# Patient Record
Sex: Male | Born: 1940 | Race: Black or African American | Hispanic: No | Marital: Married | Smoking: Former smoker
Health system: Southern US, Community
[De-identification: ages and names within clinical notes are randomized; demographics above are authoritative.]

## PROBLEM LIST (undated history)

## (undated) DIAGNOSIS — M549 Dorsalgia, unspecified: Secondary | ICD-10-CM

## (undated) DIAGNOSIS — M25519 Pain in unspecified shoulder: Secondary | ICD-10-CM

## (undated) DIAGNOSIS — M1990 Unspecified osteoarthritis, unspecified site: Secondary | ICD-10-CM

## (undated) DIAGNOSIS — M542 Cervicalgia: Secondary | ICD-10-CM

## (undated) HISTORY — DX: Cervicalgia: M54.2

## (undated) HISTORY — DX: Dorsalgia, unspecified: M54.9

## (undated) HISTORY — PX: FRACTURE SURGERY: SHX138

## (undated) HISTORY — DX: Pain in unspecified shoulder: M25.519

## (undated) HISTORY — PX: OTHER SURGICAL HISTORY: SHX169

---

## 2003-12-28 ENCOUNTER — Ambulatory Visit (HOSPITAL_COMMUNITY): Admission: RE | Admit: 2003-12-28 | Discharge: 2003-12-28 | Payer: Self-pay | Admitting: Chiropractic Medicine

## 2008-09-05 ENCOUNTER — Emergency Department (HOSPITAL_COMMUNITY): Admission: EM | Admit: 2008-09-05 | Discharge: 2008-09-05 | Payer: Self-pay | Admitting: Emergency Medicine

## 2008-11-12 ENCOUNTER — Encounter (INDEPENDENT_AMBULATORY_CARE_PROVIDER_SITE_OTHER): Payer: Self-pay | Admitting: *Deleted

## 2009-07-27 ENCOUNTER — Telehealth: Payer: Self-pay | Admitting: Gastroenterology

## 2009-08-06 ENCOUNTER — Ambulatory Visit (HOSPITAL_COMMUNITY): Admission: RE | Admit: 2009-08-06 | Discharge: 2009-08-06 | Payer: Self-pay | Admitting: Chiropractic Medicine

## 2010-06-28 NOTE — Progress Notes (Signed)
Summary: Schedule Colonoscopy  Phone Note Outgoing Call Call back at [facility] Phone [phone]   Call placed by: Harlow Mares CMA Duncan Dull),  July 27, 2009 12:01 PM Call placed to: Patient Summary of Call: No Answer, patient needs colonoscopy Initial call taken by: Harlow Mares CMA Duncan Dull),  July 27, 2009 12:01 PM  Follow-up for Phone Call        Left message on patients machine to call back.  Follow-up by: Harlow Mares CMA Duncan Dull),  August 05, 2009 12:02 PM

## 2010-08-22 LAB — CREATININE, SERUM: GFR calc non Af Amer: 53 mL/min — ABNORMAL LOW (ref >60)

## 2010-09-07 LAB — URINALYSIS, ROUTINE W REFLEX MICROSCOPIC: Leukocytes, UA: NEGATIVE

## 2010-09-07 LAB — CBC
HCT: 54.1 % — ABNORMAL HIGH (ref 39.0–52.0)
Hemoglobin: 17.8 g/dL — ABNORMAL HIGH (ref 13.0–17.0)
MCV: 88.5 fL (ref 78.0–100.0)
Platelets: 184 10*3/uL (ref 150–400)
RBC: 6.11 MIL/uL — ABNORMAL HIGH (ref 4.22–5.81)
RDW: 14 % (ref 11.5–15.5)
WBC: 6.4 10*3/uL (ref 4.0–10.5)

## 2010-09-07 LAB — COMPREHENSIVE METABOLIC PANEL
ALT: 32 U/L (ref 0–53)
BUN: 27 mg/dL — ABNORMAL HIGH (ref 6–23)
CO2: 17 mEq/L — ABNORMAL LOW (ref 19–32)
GFR calc Af Amer: 38 mL/min — ABNORMAL LOW (ref >60)
GFR calc non Af Amer: 31 mL/min — ABNORMAL LOW (ref >60)
Glucose, Bld: 117 mg/dL — ABNORMAL HIGH (ref 70–99)
Potassium: 4 mEq/L (ref 3.5–5.1)
Sodium: 133 mEq/L — ABNORMAL LOW (ref 135–145)

## 2010-09-07 LAB — DIFFERENTIAL
Monocytes Absolute: 0.9 10*3/uL (ref 0.1–1.0)
Monocytes Relative: 14 % — ABNORMAL HIGH (ref 3–12)
Neutro Abs: 4.1 10*3/uL (ref 1.7–7.7)
Neutrophils Relative %: 64 % (ref 43–77)

## 2010-09-07 LAB — URINE MICROSCOPIC-ADD ON

## 2011-12-12 ENCOUNTER — Encounter: Payer: Self-pay | Admitting: Gastroenterology

## 2012-07-18 ENCOUNTER — Ambulatory Visit: Payer: Self-pay | Admitting: Family Medicine

## 2012-07-18 VITALS — BP 133/72 | HR 78 | Temp 98.1°F | Resp 16 | Ht 69.0 in | Wt 217.0 lb

## 2012-07-18 DIAGNOSIS — Z024 Encounter for examination for driving license: Secondary | ICD-10-CM

## 2012-07-18 DIAGNOSIS — Z0289 Encounter for other administrative examinations: Secondary | ICD-10-CM

## 2012-07-18 NOTE — Patient Instructions (Signed)
Return if problems
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
## 2013-09-14 ENCOUNTER — Emergency Department (HOSPITAL_COMMUNITY)
Admission: EM | Admit: 2013-09-14 | Discharge: 2013-09-14 | Disposition: A | Discharge status: Home / Self Care | Payer: Non-veteran care | Attending: Emergency Medicine | Admitting: Emergency Medicine

## 2013-09-14 DIAGNOSIS — M62838 Other muscle spasm: Secondary | ICD-10-CM | POA: Insufficient documentation

## 2013-09-14 DIAGNOSIS — J3489 Other specified disorders of nose and nasal sinuses: Secondary | ICD-10-CM | POA: Insufficient documentation

## 2013-09-14 DIAGNOSIS — Z8781 Personal history of (healed) traumatic fracture: Secondary | ICD-10-CM | POA: Insufficient documentation

## 2013-09-14 DIAGNOSIS — Z87891 Personal history of nicotine dependence: Secondary | ICD-10-CM | POA: Insufficient documentation

## 2013-09-14 DIAGNOSIS — Z79899 Other long term (current) drug therapy: Secondary | ICD-10-CM | POA: Insufficient documentation

## 2013-09-14 MED ORDER — HYDROCODONE-ACETAMINOPHEN 5-325 MG PO TABS: 1.0000 | ORAL_TABLET | Freq: Four times a day (QID) | ORAL | Status: DC | PRN

## 2013-09-14 MED ORDER — DIAZEPAM 5 MG PO TABS: 5.0000 mg | ORAL_TABLET | Freq: Three times a day (TID) | ORAL | Status: DC | PRN

## 2013-09-14 MED ORDER — DIAZEPAM 5 MG PO TABS
5.0000 mg | ORAL_TABLET | Freq: Once | ORAL | Status: AC
  Administered 2013-09-14: 5 mg via ORAL
  Filled 2013-09-14: qty 1

## 2013-09-14 MED ORDER — HYDROCODONE-ACETAMINOPHEN 5-325 MG PO TABS
1.0000 | ORAL_TABLET | Freq: Once | ORAL | Status: AC
  Administered 2013-09-14: 1 via ORAL
  Filled 2013-09-14: qty 1

## 2013-09-14 NOTE — ED Provider Notes (Signed)
CSN: [id number]     Arrival date & time 09/14/13  [id number] History   First MD Initiated Contact with Patient 09/14/13 [phone]     Chief Complaint  Patient presents with  . Neck Pain     (Consider location/radiation/quality/duration/timing/severity/associated sxs/prior Treatment) Patient is a 73 y.o. male presenting with neck pain. The history is provided by the patient.  Neck Pain Pain location:  R side Quality:  Aching Pain radiates to:  Does not radiate Pain severity:  Moderate Pain is:  Same all the time Onset quality:  Gradual Duration:  2 days Timing:  Constant Progression:  Worsening Chronicity:  New Context: not fall, not jumping from heights, not lifting a heavy object, not MCA and not pedestrian accident   Relieved by:  Analgesics Worsened by:  Twisting Associated symptoms: no chest pain, no fever, no numbness, no paresis, no tingling, no visual change and no weakness     No past medical history on file. Past Surgical History  Procedure Laterality Date  . Fracture surgery     No family history on file. History  Substance Use Topics  . Smoking status: Former Games developermoker  . Smokeless tobacco: Not on file  . Alcohol Use: No    Review of Systems  Constitutional: Negative for fever and chills.  HENT: Positive for congestion (1 week ago had sinus symptoms). Negative for sneezing and tinnitus.   Respiratory: Negative for cough and shortness of breath.   Cardiovascular: Negative for chest pain.  Musculoskeletal: Positive for neck pain.  Neurological: Negative for tingling, weakness and numbness.  All other systems reviewed and are negative.     Allergies  Review of patient's allergies indicates no known allergies.  Home Medications   Prior to Admission medications   Medication Sig Start Date End Date Taking? Authorizing Provider  CALCIUM PO Take 1 tablet by mouth daily.   Yes Historical Provider, MD  Cholecalciferol (VITAMIN D PO) Take 1 tablet by mouth daily.   Yes  Historical Provider, MD  HYDROcodone-acetaminophen (NORCO/VICODIN) 5-325 MG per tablet Take by mouth every 6 (six) hours as needed for moderate pain or severe pain.   Yes Historical Provider, MD  Multiple Vitamin (MULTIVITAMIN WITH MINERALS) TABS tablet Take 1 tablet by mouth daily.   Yes Historical Provider, MD  Omega-3 Fatty Acids (FISH OIL PO) Take 1 capsule by mouth daily.   Yes Historical Provider, MD   BP 152/96  Pulse 96  Temp(Src) 98.7 F (37.1 C) (Oral)  Resp 18  Ht 5\' 11"  (1.803 m)  Wt 210 lb (95.255 kg)  BMI 29.30 kg/m2  SpO2 95% Physical Exam  Nursing note and vitals reviewed. Constitutional: He is oriented to person, place, and time. He appears well-developed and well-nourished. No distress.  HENT:  Head: Normocephalic and atraumatic.  Mouth/Throat: No oropharyngeal exudate.  Eyes: EOM are normal. Pupils are equal, round, and reactive to light.  Neck: Neck supple. No spinous process tenderness and no muscular tenderness present. Carotid bruit is not present. No rigidity. Decreased range of motion present.    Cardiovascular: Normal rate and regular rhythm.  Exam reveals no friction rub.   No murmur heard. Pulmonary/Chest: Effort normal and breath sounds normal. No respiratory distress. He has no wheezes. He has no rales.  Abdominal: Soft. He exhibits no distension. There is no tenderness. There is no rebound.  Musculoskeletal: He exhibits no edema.       Right shoulder: He exhibits spasm.       Arms: Neurological: He  is alert and oriented to person, place, and time.  Skin: He is not diaphoretic.    ED Course  Procedures (including critical care time) Labs Review Labs Reviewed - No data to display  Imaging Review No results found.   EKG Interpretation None      MDM   Final diagnoses:  Muscle spasms of neck    73 year old male with gradual onset right neck pain. No trauma. Pain since Friday. Some relief with Vicodin. He was like muscle cramps. Pain with  turning neck to the side. No fevers, muscle weakness, tingling. Here vitals are stable. On exam he has right upper trapezius is and sternocleidomastoid spasm. No spasm on left. Neurovascularly intact distally, normal neuro exam. No bruits on exam. Without history of trauma, vascular injury, other anterior neck pain, likely muscle spasm. Will treat with Valium and Vicodin. No need for imaging at this time. Spasm alleviated with meds, still hurting, but improved. Clinically spasm almost resolved. Given vicodin, valium to help with pain at home, instructed to f/u with PCP.  Dagmar HaitWilliam Orbie Grupe, MD 09/14/13 [phone]

## 2013-09-14 NOTE — Discharge Instructions (Signed)
°  Muscle Cramps and Spasms °Muscle cramps and spasms occur when a muscle or muscles tighten and you have no control over this tightening (involuntary muscle contraction). They are a common problem and can develop in any muscle. The most common place is in the calf muscles of the leg. Both muscle cramps and muscle spasms are involuntary muscle contractions, but they also have differences:  °· Muscle cramps are sporadic and painful. They may last a few seconds to a quarter of an hour. Muscle cramps are often more forceful and last longer than muscle spasms. °· Muscle spasms may or may not be painful. They may also last just a few seconds or much longer. °CAUSES  °It is uncommon for cramps or spasms to be due to a serious underlying problem. In many cases, the cause of cramps or spasms is unknown. Some common causes are:  °· Overexertion.   °· Overuse from repetitive motions (doing the same thing over and over).   °· Remaining in a certain position for a long period of time.   °· Improper preparation, form, or technique while performing a sport or activity.   °· Dehydration.   °· Injury.   °· Side effects of some medicines.   °· Abnormally low levels of the salts and ions in your blood (electrolytes), especially potassium and calcium. This could happen if you are taking water pills (diuretics) or you are pregnant.   °Some underlying medical problems can make it more likely to develop cramps or spasms. These include, but are not limited to:  °· Diabetes.   °· Parkinson disease.   °· Hormone disorders, such as thyroid problems.   °· Alcohol abuse.   °· Diseases specific to muscles, joints, and bones.   °· Blood vessel disease where not enough blood is getting to the muscles.   °HOME CARE INSTRUCTIONS  °· Stay well hydrated. Drink enough water and fluids to keep your urine clear or pale yellow. °· It may be helpful to massage, stretch, and relax the affected muscle. °· For tight or tense muscles, use a warm towel, heating  pad, or hot shower water directed to the affected area. °· If you are sore or have pain after a cramp or spasm, applying ice to the affected area may relieve discomfort. °· Put ice in a plastic bag. °· Place a towel between your skin and the bag. °· Leave the ice on for 15-20 minutes, 03-04 times a day. °· Medicines used to treat a known cause of cramps or spasms may help reduce their frequency or severity. Only take over-the-counter or prescription medicines as directed by your caregiver. °SEEK MEDICAL CARE IF:  °Your cramps or spasms get more severe, more frequent, or do not improve over time.  °MAKE SURE YOU:  °· Understand these instructions. °· Will watch your condition. °· Will get help right away if you are not doing well or get worse. °Document Released: 11/04/2001 Document Revised: 09/09/2012 Document Reviewed: 05/01/2012 °[employer]® Patient Information ©2014 [employer]. ° ° °

## 2013-09-14 NOTE — ED Notes (Signed)
Patient reports he has had neck pain since Friday, and says it feels like when you wake up with a cramp in your neck, only worse.  He took 1 hydrocodone/acetaminophen tablet for pain management, that only had small relief.

## 2013-11-10 ENCOUNTER — Ambulatory Visit: Payer: Non-veteran care | Admitting: Interventional Cardiology

## 2015-10-18 ENCOUNTER — Encounter (HOSPITAL_COMMUNITY): Payer: Self-pay | Admitting: *Deleted

## 2015-10-18 ENCOUNTER — Emergency Department (HOSPITAL_COMMUNITY)
Admission: EM | Admit: 2015-10-18 | Discharge: 2015-10-18 | Disposition: A | Discharge status: Home / Self Care | Payer: No Typology Code available for payment source | Attending: Emergency Medicine | Admitting: Emergency Medicine

## 2015-10-18 DIAGNOSIS — Z79899 Other long term (current) drug therapy: Secondary | ICD-10-CM | POA: Insufficient documentation

## 2015-10-18 DIAGNOSIS — Y998 Other external cause status: Secondary | ICD-10-CM | POA: Diagnosis not present

## 2015-10-18 DIAGNOSIS — Y9389 Activity, other specified: Secondary | ICD-10-CM | POA: Insufficient documentation

## 2015-10-18 DIAGNOSIS — Y92410 Unspecified street and highway as the place of occurrence of the external cause: Secondary | ICD-10-CM | POA: Insufficient documentation

## 2015-10-18 DIAGNOSIS — S3992XA Unspecified injury of lower back, initial encounter: Secondary | ICD-10-CM | POA: Diagnosis present

## 2015-10-18 DIAGNOSIS — S161XXA Strain of muscle, fascia and tendon at neck level, initial encounter: Secondary | ICD-10-CM | POA: Diagnosis not present

## 2015-10-18 DIAGNOSIS — Z87891 Personal history of nicotine dependence: Secondary | ICD-10-CM | POA: Insufficient documentation

## 2015-10-18 DIAGNOSIS — S39012A Strain of muscle, fascia and tendon of lower back, initial encounter: Secondary | ICD-10-CM

## 2015-10-18 MED ORDER — HYDROCODONE-ACETAMINOPHEN 5-325 MG PO TABS: 1.0000 | ORAL_TABLET | Freq: Four times a day (QID) | ORAL | Status: DC | PRN
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
## 2015-10-18 NOTE — ED Notes (Signed)
1st and 2nd right toes taped with 2x2 between. Good cap refill. Soft cervical collar applied and adjusted. No airway impairment and pt states comfortable.
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
## 2015-10-18 NOTE — ED Notes (Signed)
Pt states he understands instructions  

## 2015-10-18 NOTE — ED Notes (Signed)
Pt to room 19 [address]. Moves to bed without assist.

## 2015-11-23 ENCOUNTER — Other Ambulatory Visit: Payer: Self-pay | Admitting: Chiropractic Medicine

## 2015-11-23 DIAGNOSIS — M5136 Other intervertebral disc degeneration, lumbar region: Secondary | ICD-10-CM

## 2015-11-23 DIAGNOSIS — M5416 Radiculopathy, lumbar region: Secondary | ICD-10-CM

## 2015-12-13 ENCOUNTER — Ambulatory Visit
Admission: RE | Admit: 2015-12-13 | Discharge: 2015-12-13 | Disposition: A | Discharge status: Home / Self Care | Payer: Self-pay | Source: Ambulatory Visit | Attending: Chiropractic Medicine | Admitting: Chiropractic Medicine

## 2015-12-13 DIAGNOSIS — M5416 Radiculopathy, lumbar region: Secondary | ICD-10-CM

## 2015-12-13 DIAGNOSIS — M5136 Other intervertebral disc degeneration, lumbar region: Secondary | ICD-10-CM

## 2016-06-12 ENCOUNTER — Encounter: Payer: Self-pay | Admitting: Neurology

## 2016-06-12 ENCOUNTER — Ambulatory Visit (INDEPENDENT_AMBULATORY_CARE_PROVIDER_SITE_OTHER): Payer: Self-pay | Admitting: Neurology

## 2016-06-12 DIAGNOSIS — R200 Anesthesia of skin: Secondary | ICD-10-CM

## 2016-06-12 DIAGNOSIS — M791 Myalgia, unspecified site: Secondary | ICD-10-CM

## 2016-06-12 DIAGNOSIS — M5440 Lumbago with sciatica, unspecified side: Secondary | ICD-10-CM

## 2016-06-12 DIAGNOSIS — M5010 Cervical disc disorder with radiculopathy, unspecified cervical region: Secondary | ICD-10-CM

## 2016-06-12 DIAGNOSIS — R519 Headache, unspecified: Secondary | ICD-10-CM

## 2016-06-12 DIAGNOSIS — F0781 Postconcussional syndrome: Secondary | ICD-10-CM

## 2016-06-12 DIAGNOSIS — R51 Headache: Secondary | ICD-10-CM

## 2016-06-12 DIAGNOSIS — M48061 Spinal stenosis, lumbar region without neurogenic claudication: Secondary | ICD-10-CM

## 2016-06-12 DIAGNOSIS — M542 Cervicalgia: Secondary | ICD-10-CM

## 2016-06-12 DIAGNOSIS — G8929 Other chronic pain: Secondary | ICD-10-CM

## 2016-06-12 MED ORDER — CYCLOBENZAPRINE HCL 5 MG PO TABS: 5.0000 mg | ORAL_TABLET | Freq: Every day | ORAL | 2 refills | Status: DC
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
## 2016-06-12 NOTE — Patient Instructions (Signed)
Remember to drink plenty of fluid, eat healthy meals and do not skip any meals. Try to eat protein with a every meal and eat a healthy snack such as fruit or nuts in between meals. Try to keep a regular sleep-wake schedule and try to exercise daily, particularly in the form of walking, 20-30 minutes a day, if you can.   As far as your medications are concerned, I would like to suggest: Flexeril at night for the muscle spasms  As far as diagnostic testing: MRI cervical spine, Physical therapy and evaluation by Dr. Shon [facility]atonBrooks [facility]  I would like to see you back if needed, sooner if we need to. Please call us with any interim questions, concerns, problems, updates or refill requests.   Our phone number is [phone]. We also have an after hours call service for urgent matters and there is a physician on-call for urgent questions. For any emergencies you know to call 911 or go to the nearest emergency room  Cyclobenzaprine tablets What is this medicine? CYCLOBENZAPRINE (sye kloe BEN za preen) is a muscle relaxer. It is used to treat muscle pain, spasms, and stiffness. This medicine may be used for other purposes; ask your health care provider or pharmacist if you have questions. COMMON BRAND NAME(S): Fexmid, Flexeril What should I tell my health care provider before I take this medicine? They need to know if you have any of these conditions: -heart disease, irregular heartbeat, or previous heart attack -liver disease -thyroid problem -an unusual or allergic reaction to cyclobenzaprine, tricyclic antidepressants, lactose, other medicines, foods, dyes, or preservatives -pregnant or trying to get pregnant -breast-feeding How should I use this medicine? Take this medicine by mouth with a glass of water. Follow the directions on the prescription label. If this medicine upsets your stomach, take it with food or milk. Take your medicine at regular intervals. Do not take it more often  than directed. Talk to your pediatrician regarding the use of this medicine in children. Special care may be needed. Overdosage: If you think you have taken too much of this medicine contact a poison control center or emergency room at once. NOTE: This medicine is only for you. Do not share this medicine with others. What if I miss a dose? If you miss a dose, take it as soon as you can. If it is almost time for your next dose, take only that dose. Do not take double or extra doses. What may interact with this medicine? Do not take this medicine with any of the following medications: -certain medicines for fungal infections like fluconazole, itraconazole, ketoconazole, posaconazole, voriconazole -cisapride -dofetilide -dronedarone -halofantrine -levomethadyl -MAOIs like Carbex, Eldepryl, Marplan, Nardil, and Parnate -narcotic medicines for cough -pimozide -thioridazine -ziprasidone This medicine may also interact with the following medications: -alcohol -antihistamines for allergy, cough and cold -certain medicines for anxiety or sleep -certain medicines for cancer -certain medicines for depression like amitriptyline, fluoxetine, sertraline -certain medicines for infection like alfuzosin, chloroquine, clarithromycin, levofloxacin, mefloquine, pentamidine, troleandomycin -certain medicines for irregular heart beat -certain medicines for seizures like phenobarbital, primidone -contrast dyes -general anesthetics like halothane, isoflurane, methoxyflurane, propofol -local anesthetics like lidocaine, pramoxine, tetracaine -medicines that relax muscles for surgery -narcotic medicines for pain -other medicines that prolong the QT interval (cause an abnormal heart rhythm) -phenothiazines like chlorpromazine, mesoridazine, prochlorperazine This list may not describe all possible interactions. Give your health care provider a list of all the medicines, herbs, non-prescription drugs, or  dietary supplements you use. Also  tell them if you smoke, drink alcohol, or use illegal drugs. Some items may interact with your medicine. What should I watch for while using this medicine? Tell your doctor or health care professional if your symptoms do not start to get better or if they get worse. You may get drowsy or dizzy. Do not drive, use machinery, or do anything that needs mental alertness until you know how this medicine affects you. Do not stand or sit up quickly, especially if you are an older patient. This reduces the risk of dizzy or fainting spells. Alcohol may interfere with the effect of this medicine. Avoid alcoholic drinks. If you are taking another medicine that also causes drowsiness, you may have more side effects. Give your health care provider a list of all medicines you use. Your doctor will tell you how much medicine to take. Do not take more medicine than directed. Call emergency for help if you have problems breathing or unusual sleepiness. Your mouth may get dry. Chewing sugarless gum or sucking hard candy, and drinking plenty of water may help. Contact your doctor if the problem does not go away or is severe. What side effects may I notice from receiving this medicine? Side effects that you should report to your doctor or health care professional as soon as possible: -allergic reactions like skin rash, itching or hives, swelling of the face, lips, or tongue -breathing problems -chest pain -fast, irregular heartbeat -hallucinations -seizures -unusually weak or tired Side effects that usually do not require medical attention (report to your doctor or health care professional if they continue or are bothersome): -headache -nausea, vomiting This list may not describe all possible side effects. Call your doctor for medical advice about side effects. You may report side effects to FDA at 1-800-FDA-1088. Where should I keep my medicine? Keep out of the reach of  children. Store at room temperature between 15 and 30 degrees C (59 and 86 degrees F). Keep container tightly closed. Throw away any unused medicine after the expiration date. NOTE: This sheet is a summary. It may not cover all possible information. If you have questions about this medicine, talk to your doctor, pharmacist, or health care provider.  2017 Elsevier/Gold Standard (2015-02-23 12:05:46)

## 2016-06-14 ENCOUNTER — Encounter: Payer: Self-pay | Admitting: Neurology

## 2016-06-22 ENCOUNTER — Telehealth: Payer: Self-pay | Admitting: *Deleted

## 2016-06-22 NOTE — Telephone Encounter (Signed)
Received call from patient stating he wanted Dr Lucia GaskinsAhern to know that he had scans done previously at [facility]  [address] He stated he had his neck, shoulder and back scanned in  11/2015. He wanted Dr Lucia GaskinsAhern to be aware. Per notes in MRI appointments, patient was unable to afford those scans Dr Lucia GaskinsAhern ordered on 06/14/16. Will route to RN.

## 2016-06-22 NOTE — Telephone Encounter (Signed)
Called and LVM relaying Dr Trevor MaceAhern's message below. Gave [facility] phone number if he has further questions or concerns.

## 2016-06-22 NOTE — Telephone Encounter (Signed)
Dr Lucia GaskinsAhern- Lorain ChildesFYI. Do you want me to do anything further?

## 2016-06-22 NOTE — Telephone Encounter (Signed)
Thanks emma, those images are not in epic so I don't have access to them or know what kind of images they are or their results. I referred him to Dr. Shon BatonBrooks in Orthopaedics due to findings in the mri of his lumbar spine and I suggest that he get those images from july on disk and have Dr. Shon BatonBrooks take a look at those images of his neck and shoulder and then Dr. Shon BatonBrooks can if MRI cervical spine is still warranted. But I still highly recommend the MRI of the brain. Thanks emma, if you don;t mind please let patient know thanks!

## 2016-06-22 NOTE — Telephone Encounter (Incomplete)
Thanks emma. I don;t have access to these scans so I am not sure what kind of scans those are, they are not in EPIC. Regardless I still recommend MRI brain and cervical spine.  thanks

## 2016-06-23 ENCOUNTER — Telehealth: Payer: Self-pay | Admitting: Neurology

## 2016-06-23 NOTE — Telephone Encounter (Signed)
Annabelle HarmanDana, can we follow up on the referral to dr brooks for lumbar radiculopathy? Also make sure patient brings his mri lumbar spine on a disk to his appointment thanks

## 2016-06-23 NOTE — Telephone Encounter (Signed)
Patient returning nurse's call. Please call  °

## 2016-06-23 NOTE — Telephone Encounter (Signed)
Returned patient's call and repeated both of Dr Trevor MaceAhern's messages. He stated he already had the disks from his previous scans. He asked for Dr Shon BatonBrooks address, phone; given to him.  This RN advised he call Dr Shon BatonBrooks office today and check on referral. Advised him again that Dr Lucia GaskinsAhern still recommends MRI brain. He verbalized understanding, appreciation of call back.

## 2016-06-23 NOTE — Telephone Encounter (Signed)
error 

## 2016-06-26 ENCOUNTER — Telehealth: Payer: Self-pay | Admitting: Neurology

## 2016-06-26 ENCOUNTER — Ambulatory Visit (INDEPENDENT_AMBULATORY_CARE_PROVIDER_SITE_OTHER): Payer: Self-pay | Admitting: Family Medicine

## 2016-06-26 VITALS — BP 158/106 | Temp 98.3°F | Ht 71.0 in | Wt 207.2 lb

## 2016-06-26 DIAGNOSIS — Z024 Encounter for examination for driving license: Secondary | ICD-10-CM

## 2016-06-26 DIAGNOSIS — R030 Elevated blood-pressure reading, without diagnosis of hypertension: Secondary | ICD-10-CM

## 2016-06-26 NOTE — Telephone Encounter (Signed)
Gave Dr Lucia GaskinsAhern faxed notice of this as FYI also.

## 2016-06-26 NOTE — Telephone Encounter (Signed)
Patient is not ready to schedule at this time . [facility] patient will call back when he is ready to schedule.

## 2016-06-26 NOTE — Patient Instructions (Addendum)
  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 Follow up with your primary care provider to discuss blood pressure and recheck to determine if medication needed.   IF you received an x-ray today, you will receive an invoice from [facility]. Please contact [facility] at [phone] with questions or concerns regarding your invoice.   IF you received labwork today, you will receive an invoice from [facility]. Please contact [facility] at [phone] with questions or concerns regarding your invoice.   Our billing staff will not be able to assist you with questions regarding bills from these companies.  You will be contacted with the lab results as soon as they are available. The fastest way to get your results is to activate your My Chart account. Instructions are located on the last page of this paperwork. If you have not heard from us regarding the results in 2 weeks, please contact this office.
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
## 2016-06-26 NOTE — Telephone Encounter (Signed)
Called and left to make sure he had MRI CD.

## 2017-01-02 ENCOUNTER — Encounter: Payer: Self-pay | Admitting: Physician Assistant

## 2017-01-19 ENCOUNTER — Encounter (HOSPITAL_COMMUNITY): Payer: Self-pay | Admitting: Emergency Medicine

## 2017-01-19 ENCOUNTER — Emergency Department (HOSPITAL_COMMUNITY): Payer: Self-pay

## 2017-01-19 ENCOUNTER — Emergency Department (HOSPITAL_COMMUNITY)
Admission: EM | Admit: 2017-01-19 | Discharge: 2017-01-19 | Disposition: A | Discharge status: Home / Self Care | Payer: Self-pay | Attending: Emergency Medicine | Admitting: Emergency Medicine

## 2017-01-19 DIAGNOSIS — M5442 Lumbago with sciatica, left side: Secondary | ICD-10-CM | POA: Insufficient documentation

## 2017-01-19 DIAGNOSIS — Z87891 Personal history of nicotine dependence: Secondary | ICD-10-CM | POA: Insufficient documentation

## 2017-01-19 MED ORDER — PREDNISONE 20 MG PO TABS: ORAL_TABLET | ORAL | 0 refills | Status: DC

## 2017-01-19 MED ORDER — HYDROCODONE-ACETAMINOPHEN 5-325 MG PO TABS: 1.0000 | ORAL_TABLET | ORAL | 0 refills | Status: DC | PRN

## 2017-01-19 NOTE — ED Notes (Signed)
Returned from xray
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
## 2017-01-19 NOTE — ED Notes (Signed)
C/O LBP to left leg x 2 days. Hx of "ruptured disc" in the past with sx per Dr. Jeral Fruit. Has some difficulty with ambulation.

## 2017-01-19 NOTE — Discharge Instructions (Signed)
Recommend to start the steroid taper today. You can reserve the pain medication for only as needed usage. Follow-up with the spine specialist-- call to make  an appt. Return to the ED for new or worsening symptoms.

## 2018-09-21 ENCOUNTER — Other Ambulatory Visit: Payer: Self-pay

## 2018-09-21 ENCOUNTER — Encounter (HOSPITAL_COMMUNITY): Payer: Self-pay

## 2018-09-21 ENCOUNTER — Emergency Department (HOSPITAL_COMMUNITY): Payer: Non-veteran care

## 2018-09-21 ENCOUNTER — Emergency Department (HOSPITAL_COMMUNITY)
Admission: EM | Admit: 2018-09-21 | Discharge: 2018-09-21 | Disposition: A | Discharge status: Home / Self Care | Payer: Non-veteran care | Attending: Emergency Medicine | Admitting: Emergency Medicine

## 2018-09-21 DIAGNOSIS — Z87891 Personal history of nicotine dependence: Secondary | ICD-10-CM | POA: Insufficient documentation

## 2018-09-21 DIAGNOSIS — R030 Elevated blood-pressure reading, without diagnosis of hypertension: Secondary | ICD-10-CM | POA: Insufficient documentation

## 2018-09-21 DIAGNOSIS — R1032 Left lower quadrant pain: Secondary | ICD-10-CM | POA: Diagnosis present

## 2018-09-21 DIAGNOSIS — R109 Unspecified abdominal pain: Secondary | ICD-10-CM

## 2018-09-21 LAB — URINALYSIS, ROUTINE W REFLEX MICROSCOPIC
Bilirubin Urine: NEGATIVE
Glucose, UA: NEGATIVE mg/dL
Hgb urine dipstick: NEGATIVE
Ketones, ur: NEGATIVE mg/dL
Leukocytes,Ua: NEGATIVE
Nitrite: NEGATIVE
Protein, ur: NEGATIVE mg/dL
Specific Gravity, Urine: 1.015 (ref 1.005–1.030)
pH: 7 (ref 5.0–8.0)

## 2018-09-21 LAB — LACTIC ACID, PLASMA: Lactic Acid, Venous: 1 mmol/L (ref 0.5–1.9)

## 2018-09-21 LAB — COMPREHENSIVE METABOLIC PANEL
ALT: 12 U/L (ref 0–44)
AST: 16 U/L (ref 15–41)
Albumin: 3.8 g/dL (ref 3.5–5.0)
Alkaline Phosphatase: 84 U/L (ref 38–126)
Anion gap: 10 (ref 5–15)
BUN: 12 mg/dL (ref 8–23)
CO2: 27 mmol/L (ref 22–32)
Calcium: 9.3 mg/dL (ref 8.9–10.3)
Chloride: 100 mmol/L (ref 98–111)
Creatinine, Ser: 1.37 mg/dL — ABNORMAL HIGH (ref 0.61–1.24)
GFR calc Af Amer: 57 mL/min — ABNORMAL LOW (ref >60)
GFR calc non Af Amer: 49 mL/min — ABNORMAL LOW (ref >60)
Glucose, Bld: 103 mg/dL — ABNORMAL HIGH (ref 70–99)
Potassium: 4.5 mmol/L (ref 3.5–5.1)
Sodium: 137 mmol/L (ref 135–145)
Total Bilirubin: 1.5 mg/dL — ABNORMAL HIGH (ref 0.3–1.2)
Total Protein: 7.3 g/dL (ref 6.5–8.1)

## 2018-09-21 LAB — CBC
HCT: 47.6 % (ref 39.0–52.0)
Hemoglobin: 15.7 g/dL (ref 13.0–17.0)
MCH: 28.9 pg (ref 26.0–34.0)
MCHC: 33 g/dL (ref 30.0–36.0)
MCV: 87.5 fL (ref 80.0–100.0)
Platelets: 179 10*3/uL (ref 150–400)
RBC: 5.44 MIL/uL (ref 4.22–5.81)
RDW: 13.3 % (ref 11.5–15.5)
WBC: 4.6 10*3/uL (ref 4.0–10.5)
nRBC: 0 % (ref 0.0–0.2)

## 2018-09-21 LAB — LIPASE, BLOOD: Lipase: 23 U/L (ref 11–51)

## 2018-09-21 MED ORDER — SODIUM CHLORIDE 0.9 % IV BOLUS
500.0000 mL | Freq: Once | INTRAVENOUS | Status: AC
  Administered 2018-09-21: 11:00:00 500 mL via INTRAVENOUS

## 2018-09-21 MED ORDER — METHOCARBAMOL 500 MG PO TABS: 500.0000 mg | ORAL_TABLET | Freq: Two times a day (BID) | ORAL | 0 refills | Status: DC | PRN

## 2018-09-21 MED ORDER — METHOCARBAMOL 500 MG PO TABS
500.0000 mg | ORAL_TABLET | Freq: Once | ORAL | Status: AC
  Administered 2018-09-21: 500 mg via ORAL
  Filled 2018-09-21: qty 1

## 2018-09-21 MED ORDER — HYDROMORPHONE HCL 1 MG/ML IJ SOLN
0.5000 mg | Freq: Once | INTRAMUSCULAR | Status: AC
  Administered 2018-09-21: 11:00:00 0.5 mg via INTRAVENOUS
  Filled 2018-09-21: qty 1

## 2018-09-21 MED ORDER — IOHEXOL 300 MG/ML  SOLN
100.0000 mL | Freq: Once | INTRAMUSCULAR | Status: AC | PRN
  Administered 2018-09-21: 13:00:00 100 mL via INTRAVENOUS

## 2018-09-21 MED ORDER — ACETAMINOPHEN 325 MG PO TABS
325.0000 mg | ORAL_TABLET | Freq: Once | ORAL | Status: AC
  Administered 2018-09-21: 325 mg via ORAL
  Filled 2018-09-21: qty 1

## 2018-09-21 NOTE — ED Notes (Signed)
Pt returned to room from CT
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
## 2018-09-21 NOTE — ED Provider Notes (Signed)
Care assumed from Dr. Pilar Plate.  Please see his full H&P.  In short,  Travis Mcconnell is a 78 y.o. male presents for  Left lower quadrant pain x 2 day that is worse with movement. He denies fever, chest pain, dyspnea, diarrhea, urinary symptoms. Work up start by previous provider includes CBC unremarkable, lipase 23 within normal limits. CMP shows elevated creatinine of 1.37, Lactic acid within normal limits. UA pending. CT A/P pending.   Physical Exam  BP (!) 158/111   Pulse 65   Temp 98.4 F (36.9 C) (Oral)   Resp 17   Ht 5' 10.5" (1.791 m)   Wt 85.3 kg   SpO2 96%   BMI 26.59 kg/m   Physical Exam Vitals signs and nursing note reviewed.  Constitutional:      Appearance: He is well-developed. He is not toxic-appearing.  HENT:     Head: Normocephalic and atraumatic.     Nose: Nose normal.     Mouth/Throat:     Mouth: Mucous membranes are moist.     Pharynx: Oropharynx is clear.  Eyes:     General: No scleral icterus.       Right eye: No discharge.        Left eye: No discharge.     Conjunctiva/sclera: Conjunctivae normal.  Neck:     Musculoskeletal: Normal range of motion.  Cardiovascular:     Rate and Rhythm: Normal rate and regular rhythm.     Pulses: Normal pulses.     Heart sounds: Normal heart sounds.  Pulmonary:     Effort: Pulmonary effort is normal.     Breath sounds: Normal breath sounds.  Abdominal:     General: Bowel sounds are normal. There is no distension.     Palpations: Abdomen is soft. There is no mass.     Tenderness: There is abdominal tenderness (LLQ). There is no right CVA tenderness, left CVA tenderness, guarding or rebound.     Hernia: No hernia is present.  Musculoskeletal: Normal range of motion.     Right lower leg: No edema.     Left lower leg: No edema.  Skin:    General: Skin is warm and dry.     Findings: No rash.  Neurological:     Mental Status: He is oriented to person, place, and time.     Comments: Fluent speech, no facial droop.   Psychiatric:        Behavior: Behavior normal.     ED Course/Procedures    Results for orders placed or performed during the hospital encounter of 09/21/18 (from the past 48 hour(s))  CBC     Status: None   Collection Time: 09/21/18 10:11 AM  Result Value Ref Range   WBC 4.6 4.0 - 10.5 K/uL   RBC 5.44 4.22 - 5.81 MIL/uL   Hemoglobin 15.7 13.0 - 17.0 g/dL   HCT 95.2 84.1 - 32.4 %   MCV 87.5 80.0 - 100.0 fL   MCH 28.9 26.0 - 34.0 pg   MCHC 33.0 30.0 - 36.0 g/dL   RDW 40.1 02.7 - 25.3 %   Platelets 179 150 - 400 K/uL   nRBC 0.0 0.0 - 0.2 %    Comment: Performed at [facility], [address] [place], [place] [address]  Comprehensive metabolic panel     Status: Abnormal   Collection Time: 09/21/18 10:11 AM  Result Value Ref Range   Sodium 137 135 - 145 mmol/L   Potassium  4.5 3.5 - 5.1 mmol/L   Chloride 100 98 - 111 mmol/L   CO2 27 22 - 32 mmol/L   Glucose, Bld 103 (H) 70 - 99 mg/dL   BUN 12 8 - 23 mg/dL   Creatinine, Ser 8.651.37 (H) 0.61 - 1.24 mg/dL   Calcium 9.3 8.9 - 78.410.3 mg/dL   Total Protein 7.3 6.5 - 8.1 g/dL   Albumin 3.8 3.5 - 5.0 g/dL   AST 16 15 - 41 U/L   ALT 12 0 - 44 U/L   Alkaline Phosphatase 84 38 - 126 U/L   Total Bilirubin 1.5 (H) 0.3 - 1.2 mg/dL   GFR calc non Af Amer 49 (L) >60 mL/min   GFR calc Af Amer 57 (L) >60 mL/min   Anion gap 10 5 - 15    Comment: Performed at [facility], [address] [place], [place] [address]  Lipase, blood     Status: None   Collection Time: 09/21/18 10:11 AM  Result Value Ref Range   Lipase 23 11 - 51 U/L    Comment: Performed at [facility], [address] [place], [place] [address]  Lactic acid, plasma     Status: None   Collection Time: 09/21/18 10:11 AM  Result Value Ref Range   Lactic Acid, Venous 1.0 0.5 - 1.9 mmol/L    Comment: Performed at [facility], [address] [place], [place] [address]     EKG Interpretation  Date/Time:  Saturday September 21 2018 09:27:40 EDT Ventricular  Rate:  81 PR Interval:    QRS Duration: 110 QT Interval:  379 QTC Calculation: 440 R Axis:   76 Text Interpretation:  Sinus rhythm Nonspecific repol abnormality, inferior leads Borderline ST elevation, anterolateral leads no prior Confirmed by Kennis CarinaBero, Michael [phone]([id number]) on 09/21/2018 10:29:23 AM        MDM    Pt has elevated blood pressure during today's visit.  He does not take any medications for hypertension.  He states he takes his blood pressure daily and it has been normal.  Patient admits to normal blood pressure yesterday, will recommend he have it rechecked by PCP given his elevated today.  Patient's pain has improved after Dilaudid.  On repeat exam abdomen is benign.  EKG is without ischemic changes.  UA is negative for any signs of infection.  As well as unremarkable CBC, lipase and lactic acid.  Creatinine is elevated to 1.34, patient given small fluid bolus.  CT abdomen shows ?ileus and is without signs of diverticulitis or other acute processes.  Patient is tolerating p.o. fluids and crackers.  The patient was discussed with and seen by Dr. Pilar PlateBero who agrees with the treatment plan.  He recommends sending patient home a short course of muscle relaxer as this could be musculoskeletal pain.  Discussed imaging findings with patient and recommend conservative diet as well as close PCP follow-up to have blood pressure rechecked as well as creatinine.  Patient is comfortable with above plan and is stable for discharge at this time. All questions were answered prior to disposition. Strict return precautions for returning to the ED were discussed.     This note was prepared with assistance of Conservation officer, historic buildingsDragon voice recognition software. Occasional wrong-word or sound-a-like substitutions may have occurred due to the inherent limitations of voice recognition software.    Kathyrn Lasslbrizze, Jania Steinke E, PA-C 09/22/18 2121    Sabas SousBero, Michael M, MD 09/23/18 [id number]

## 2018-09-21 NOTE — ED Triage Notes (Signed)
Pt arrives with [facility] from home c/o left upper abdominal pain x2 days. Pt reports no PMHX. Pt BP with EMS was 218/100, CBG 98. Pt denies n/v. Pain 10/10.

## 2018-09-21 NOTE — ED Notes (Signed)
ED Provider at bedside. 

## 2018-09-21 NOTE — ED Notes (Signed)
Patient verbalizes understanding of discharge instructions. Opportunity for questioning and answers were provided. Armband removed by staff, pt discharged from ED.  

## 2018-09-21 NOTE — ED Notes (Signed)
Pt given ginger ale and tolerated well.  

## 2018-09-21 NOTE — Discharge Instructions (Signed)
You have been seen today for abdominal pain. Please read and follow all provided instructions. Return to the emergency room for worsening condition or new concerning symptoms.    It is important that you follow-up with your primary care doctor in 2 to 5 days to have your blood pressure rechecked.  Also recommend having a prostate checked.  1. Medications:  Prescription-a prescription has been sent to your pharmacy for Robaxin.  This is a muscle relaxer.  These only take if needed.  Do not drive after taking it any important decisions. Continue usual home medications Take medications as prescribed. Please review all of the medicines and only take them if you do not have an allergy to them.   2. Treatment: rest, drink plenty of fluids  3. Follow Up: Please follow up with your primary doctor in 2-5 days for discussion of your diagnoses and further evaluation after today's visit; Call today to arrange your follow up.  [facility] will [facility].  If you are unable to get [facility] with [facility] have given information for the [facility] health clinic.  I have also given you information for GI if the pain persists.   It is also a possibility that you have an allergic reaction to any of the medicines that you have been prescribed - Everybody reacts differently to medications and while MOST people have no trouble with most medicines, you may have a reaction such as nausea, vomiting, rash, swelling, shortness of breath. If this is the case, please stop taking the medicine immediately and contact your physician.  ?

## 2019-12-04 NOTE — Patient Instructions (Addendum)
DUE TO COVID-19 ONLY ONE VISITOR ARE ALLOWED TO COME WITH YOU AND STAY IN THE WAITING ROOM ONLY DURING PRE OP AND PROCEDURE. THEN TWO VISITORS MAY VISIT WITH  YOU IN YOUR PRIVATE ROOM DURING VISITING HOURS ONLY!!   COVID SWAB TESTING MUST BE COMPLETED ON:  12-08-19 at 3:05 PM [address], [place] [facility] enter pre surgical testing line (Must self quarantine after testing. Follow instructions on handout.)               Your procedure is scheduled on: 12-11-19   Report to [facility] Main  Entrance   Report to Short Stay at 5:30 AM   [facility](Map Enclosed)    Call this number if you have problems the morning of surgery [phone]   Do not eat food After Midnight may have liquids until  4:15 am day of surgery   Complete one Ensure drink the morning of surgery at 4:15 the day of surgery.   CLEAR LIQUID DIET  Foods Allowed                                                                     Foods Excluded  Water, Black Coffee and tea, regular and decaf                             liquids that you cannot  Plain Jell-O in any flavor  (No red)                                           see through such as: Fruit ices (not with fruit pulp)                                     milk, soups, orange juice  Iced Popsicles (No red)                                    All solid food                                   Apple juices Sports drinks like Gatorade (No red) Lightly seasoned clear broth or consume(fat free) Sugar, honey syrup    Take these medicines the morning of surgery with A SIP OF WATER: None   Oral Hygiene is also important to reduce your risk of infection.                                     Remember - BRUSH YOUR TEETH THE MORNING OF SURGERY WITH YOUR REGULAR TOOTHPASTE                                You may not have any metal on  your body including hair pins, jewelry, and body piercings              Do not wear lotions, powders, cologne, or  deodorant                          Men may shave face and neck.   Do not bring valuables to the hospital. [facility] IS NOT  RESPONSIBLE   FOR VALUABLES.   Contacts, dentures or bridgework may not be worn into surgery.   Bring small overnight bag day of surgery.    Special Instructions: Bring a copy of your healthcare power of attorney and living will documents the day of surgery if you haven't scanned them in before.              Please read over the following fact sheets you were given: IF YOU HAVE QUESTIONS ABOUT YOUR PRE OP INSTRUCTIONS PLEASE CALL [phone]    Before surgery, you can play an important role.  Because skin is not sterile, your skin needs to be as free of germs as possible.  You can reduce the number of germs on your skin by washing with CHG (chlorahexidine gluconate) soap before surgery.  CHG is an antiseptic cleaner which kills germs and bonds with the skin to continue killing germs even after washing. Please DO NOT use if you have an allergy to CHG or antibacterial soaps.  If your skin becomes reddened/irritated stop using the CHG and inform your nurse when you arrive at Short Stay. Do not shave (including legs and underarms) for at least 48 hours prior to the first CHG shower.  You may shave your face/neck.  Please follow these instructions carefully:  1.  Shower with CHG Soap the night before surgery and the  morning of surgery.  2.  If you choose to wash your hair, wash your hair first as usual with your normal  shampoo.  3.  After you shampoo, rinse your hair and body thoroughly to remove the shampoo.                             4.  Use CHG as you would any other liquid soap.  You can apply chg directly to the skin and wash.  Gently with a scrungie or clean washcloth.  5.  Apply the CHG Soap to your body ONLY FROM THE NECK DOWN.   Do   not use on face/ open                           Wound or open sores. Avoid contact with eyes, ears mouth and   genitals  (private parts).                       Wash face,  Genitals (private parts) with your normal soap.             6.  Wash thoroughly, paying special attention to the area where your    surgery  will be performed.  7.  Thoroughly rinse your body with warm water from the neck down.  8.  DO NOT shower/wash with your normal soap after using and rinsing off the CHG Soap.                9.  Pat yourself dry with a clean towel.  10.  Wear clean pajamas.            11.  Place clean sheets on your bed the night of your first shower and do not  sleep with pets. Day of Surgery : Do not apply any lotions/deodorants the morning of surgery.  Please wear clean clothes to the hospital/surgery center.  FAILURE TO FOLLOW THESE INSTRUCTIONS MAY RESULT IN THE CANCELLATION OF YOUR SURGERY  PATIENT SIGNATURE_________________________________  NURSE SIGNATURE__________________________________  ________________________________________________________________________   Travis Mcconnell  An incentive spirometer is a tool that can help keep your lungs clear and active. This tool measures how well you are filling your lungs with each breath. Taking long deep breaths may help reverse or decrease the chance of developing breathing (pulmonary) problems (especially infection) following:  A long period of time when you are unable to move or be active. BEFORE THE PROCEDURE   If the spirometer includes an indicator to show your best effort, your nurse or respiratory therapist will set it to a desired goal.  If possible, sit up straight or lean slightly forward. Try not to slouch.  Hold the incentive spirometer in an upright position. INSTRUCTIONS FOR USE  1. Sit on the edge of your bed if possible, or sit up as far as you can in bed or on a chair. 2. Hold the incentive spirometer in an upright position. 3. Breathe out normally. 4. Place the mouthpiece in your mouth and seal your lips tightly around  it. 5. Breathe in slowly and as deeply as possible, raising the piston or the ball toward the top of the column. 6. Hold your breath for 3-5 seconds or for as long as possible. Allow the piston or ball to fall to the bottom of the column. 7. Remove the mouthpiece from your mouth and breathe out normally. 8. Rest for a few seconds and repeat Steps 1 through 7 at least 10 times every 1-2 hours when you are awake. Take your time and take a few normal breaths between deep breaths. 9. The spirometer may include an indicator to show your best effort. Use the indicator as a goal to work toward during each repetition. 10. After each set of 10 deep breaths, practice coughing to be sure your lungs are clear. If you have an incision (the cut made at the time of surgery), support your incision when coughing by placing a pillow or rolled up towels firmly against it. Once you are able to get out of bed, walk around indoors and cough well. You may stop using the incentive spirometer when instructed by your caregiver.  RISKS AND COMPLICATIONS  Take your time so you do not get dizzy or light-headed.  If you are in pain, you may need to take or ask for pain medication before doing incentive spirometry. It is harder to take a deep breath if you are having pain. AFTER USE  Rest and breathe slowly and easily.  It can be helpful to keep track of a log of your progress. Your caregiver can provide you with a simple table to help with this. If you are using the spirometer at home, follow these instructions: [place] IF:   You are having difficultly using the spirometer.  You have trouble using the spirometer as often as instructed.  Your pain medication is not giving enough relief while using the spirometer.  You develop fever of 100.5 F (38.1 C) or higher. SEEK IMMEDIATE MEDICAL CARE IF:   You cough up bloody  sputum that had not been present before.  You develop fever of 102 F (38.9 C) or  greater.  You develop worsening pain at or near the incision site. MAKE SURE YOU:   Understand these instructions.  Will watch your condition.  Will get help right away if you are not doing well or get worse. Document Released: 09/25/2006 Document Revised: 08/07/2011 Document Reviewed: 11/26/2006 ExitCare Patient Information 2014 ExitCare, [place].   ________________________________________________________________________  WHAT IS A BLOOD TRANSFUSION? Blood Transfusion Information  A transfusion is the replacement of blood or some of its parts. Blood is made up of multiple cells which provide different functions.  Red blood cells carry oxygen and are used for blood loss replacement.  White blood cells fight against infection.  Platelets control bleeding.  Plasma helps clot blood.  Other blood products are available for specialized needs, such as hemophilia or other clotting disorders. BEFORE THE TRANSFUSION  Who gives blood for transfusions?   Healthy volunteers who are fully evaluated to make sure their blood is safe. This is blood bank blood. Transfusion therapy is the safest it has ever been in the practice of medicine. Before blood is taken from a donor, a complete history is taken to make sure that person has no history of diseases nor engages in risky social behavior (examples are intravenous drug use or sexual activity with multiple partners). The donor's travel history is screened to minimize risk of transmitting infections, such as malaria. The donated blood is tested for signs of infectious diseases, such as HIV and hepatitis. The blood is then tested to be sure it is compatible with you in order to minimize the chance of a transfusion reaction. If you or a relative donates blood, this is often done in anticipation of surgery and is not appropriate for emergency situations. It takes many days to process the donated blood. RISKS AND COMPLICATIONS Although transfusion therapy  is very safe and saves many lives, the main dangers of transfusion include:   Getting an infectious disease.  Developing a transfusion reaction. This is an allergic reaction to something in the blood you were given. Every precaution is taken to prevent this. The decision to have a blood transfusion has been considered carefully by your caregiver before blood is given. Blood is not given unless the benefits outweigh the risks. AFTER THE TRANSFUSION  Right after receiving a blood transfusion, you will usually feel much better and more energetic. This is especially true if your red blood cells have gotten low (anemic). The transfusion raises the level of the red blood cells which carry oxygen, and this usually causes an energy increase.  The nurse administering the transfusion will monitor you carefully for complications. HOME CARE INSTRUCTIONS  No special instructions are needed after a transfusion. You may find your energy is better. Speak with your caregiver about any limitations on activity for underlying diseases you may have. SEEK MEDICAL CARE IF:   Your condition is not improving after your transfusion.  You develop redness or irritation at the intravenous (IV) site. SEEK IMMEDIATE MEDICAL CARE IF:  Any of the following symptoms occur over the next 12 hours:  Shaking chills.  You have a temperature by mouth above 102 F (38.9 C), not controlled by medicine.  Chest, back, or muscle pain.  People around you feel you are not acting correctly or are confused.  Shortness of breath or difficulty breathing.  Dizziness and fainting.  You get a rash or develop hives.  You have a  decrease in urine output.  Your urine turns a dark color or changes to pink, red, or brown. Any of the following symptoms occur over the next 10 days:  You have a temperature by mouth above 102 F (38.9 C), not controlled by medicine.  Shortness of breath.  Weakness after normal activity.  The white  part of the eye turns yellow (jaundice).  You have a decrease in the amount of urine or are urinating less often.  Your urine turns a dark color or changes to pink, red, or brown. Document Released: 05/12/2000 Document Revised: 08/07/2011 Document Reviewed: 12/30/2007 [facility] Patient Information 2014 [place], [place].  _______________________________________________________________________

## 2019-12-04 NOTE — Progress Notes (Signed)
COVID Vaccine Completed: Date COVID Vaccine completed: COVID vaccine manufacturer: Pfizer    Moderna   [employer]   PCP -  Cardiologist -   Chest x-ray -  EKG -  Stress Test -  ECHO -  Cardiac Cath -   Sleep Study -  CPAP -   Fasting Blood Sugar -  Checks Blood Sugar _____ times a day  Blood Thinner Instructions: Aspirin Instructions: Last Dose:  Anesthesia review:   Patient denies shortness of breath, fever, cough and chest pain at PAT appointment   Patient verbalized understanding of instructions that were given to them at the PAT appointment. Patient was also instructed that they will need to review over the PAT instructions again at home before surgery. 

## 2019-12-05 ENCOUNTER — Encounter (HOSPITAL_COMMUNITY)
Admission: RE | Admit: 2019-12-05 | Discharge: 2019-12-05 | Disposition: A | Discharge status: Home / Self Care | Payer: No Typology Code available for payment source | Source: Ambulatory Visit | Attending: Orthopedic Surgery | Admitting: Orthopedic Surgery

## 2019-12-05 ENCOUNTER — Encounter (HOSPITAL_COMMUNITY): Payer: Self-pay

## 2019-12-05 ENCOUNTER — Other Ambulatory Visit: Payer: Self-pay

## 2019-12-05 DIAGNOSIS — Z01812 Encounter for preprocedural laboratory examination: Secondary | ICD-10-CM | POA: Insufficient documentation

## 2019-12-05 HISTORY — DX: Unspecified osteoarthritis, unspecified site: M19.90

## 2019-12-05 LAB — CBC
HCT: 44.8 % (ref 39.0–52.0)
Hemoglobin: 14.4 g/dL (ref 13.0–17.0)
MCH: 28.6 pg (ref 26.0–34.0)
MCHC: 32.1 g/dL (ref 30.0–36.0)
MCV: 88.9 fL (ref 80.0–100.0)
Platelets: 184 10*3/uL (ref 150–400)
RBC: 5.04 MIL/uL (ref 4.22–5.81)
RDW: 14.5 % (ref 11.5–15.5)
WBC: 4 10*3/uL (ref 4.0–10.5)
nRBC: 0 % (ref 0.0–0.2)

## 2019-12-05 LAB — NO BLOOD PRODUCTS

## 2019-12-05 LAB — SURGICAL PCR SCREEN
MRSA, PCR: NEGATIVE
Staphylococcus aureus: NEGATIVE

## 2019-12-05 NOTE — Progress Notes (Signed)
COVID Vaccine Completed: Date COVID Vaccine completed: COVID vaccine manufacturer: Pfizer    [employer]     PCP - [facility] [place] Cardiologist - N/A  No back stimulator  Chest x-ray -  EKG -  Stress Test -  ECHO -  Cardiac Cath -   Sleep Study -  CPAP -   Fasting Blood Sugar -  Checks Blood Sugar _____ times a day  Blood Thinner Instructions: Aspirin Instructions: Last Dose:  Anesthesia review:   ADL;s and Mows lawn w/o Shortness of breath.   Patient denies shortness of breath, fever, cough and chest pain at PAT appointment   Patient verbalized understanding of instructions that were given to them at the PAT appointment. Patient was also instructed that they will need to review over the PAT instructions again at home before surgery.

## 2019-12-05 NOTE — Progress Notes (Signed)
Refusal of blood and or all blood products faxed to Dr. Charlann Boxer and [facility] successful transmission notice on chart.

## 2019-12-08 ENCOUNTER — Inpatient Hospital Stay (HOSPITAL_COMMUNITY): Admission: RE | Admit: 2019-12-08 | Payer: Non-veteran care | Source: Ambulatory Visit
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
## 2019-12-10 NOTE — Progress Notes (Signed)
Multiple phone calls to patient and his wife about coming in 30 minutes earlier to get covid tested. No answer and unable to leave any message.

## 2019-12-11 ENCOUNTER — Encounter (HOSPITAL_COMMUNITY)
Admission: RE | Disposition: A | Discharge status: Home / Self Care | Payer: Self-pay | Source: Home / Self Care | Attending: Orthopedic Surgery

## 2019-12-11 ENCOUNTER — Other Ambulatory Visit: Payer: Self-pay

## 2019-12-11 ENCOUNTER — Ambulatory Visit (HOSPITAL_COMMUNITY): Payer: No Typology Code available for payment source | Admitting: Certified Registered Nurse Anesthetist

## 2019-12-11 ENCOUNTER — Observation Stay (HOSPITAL_COMMUNITY)
Admission: RE | Admit: 2019-12-11 | Discharge: 2019-12-16 | Disposition: A | Discharge status: Home / Self Care | Payer: No Typology Code available for payment source | Attending: Orthopedic Surgery | Admitting: Orthopedic Surgery

## 2019-12-11 ENCOUNTER — Encounter (HOSPITAL_COMMUNITY): Payer: Self-pay | Admitting: Orthopedic Surgery

## 2019-12-11 DIAGNOSIS — M1711 Unilateral primary osteoarthritis, right knee: Secondary | ICD-10-CM | POA: Diagnosis not present

## 2019-12-11 DIAGNOSIS — Z96651 Presence of right artificial knee joint: Secondary | ICD-10-CM | POA: Diagnosis not present

## 2019-12-11 DIAGNOSIS — I2510 Atherosclerotic heart disease of native coronary artery without angina pectoris: Secondary | ICD-10-CM | POA: Insufficient documentation

## 2019-12-11 DIAGNOSIS — Z7982 Long term (current) use of aspirin: Secondary | ICD-10-CM | POA: Insufficient documentation

## 2019-12-11 DIAGNOSIS — Z87891 Personal history of nicotine dependence: Secondary | ICD-10-CM | POA: Diagnosis not present

## 2019-12-11 DIAGNOSIS — Z20822 Contact with and (suspected) exposure to covid-19: Secondary | ICD-10-CM | POA: Diagnosis not present

## 2019-12-11 DIAGNOSIS — M549 Dorsalgia, unspecified: Secondary | ICD-10-CM | POA: Insufficient documentation

## 2019-12-11 DIAGNOSIS — M25561 Pain in right knee: Secondary | ICD-10-CM | POA: Diagnosis present

## 2019-12-11 HISTORY — PX: TOTAL KNEE ARTHROPLASTY: SHX125

## 2019-12-11 LAB — SARS CORONAVIRUS 2 BY RT PCR (HOSPITAL ORDER, PERFORMED IN ~~LOC~~ HOSPITAL LAB): SARS Coronavirus 2: NEGATIVE

## 2019-12-11 SURGERY — ARTHROPLASTY, KNEE, TOTAL
Anesthesia: Monitor Anesthesia Care | Site: Knee | Laterality: Right

## 2019-12-11 MED ORDER — HYDROMORPHONE HCL 1 MG/ML IJ SOLN: 0.5000 mg | INTRAMUSCULAR | Status: DC | PRN

## 2019-12-11 MED ORDER — DOCUSATE SODIUM 100 MG PO CAPS
100.0000 mg | ORAL_CAPSULE | Freq: Two times a day (BID) | ORAL | Status: DC
  Administered 2019-12-11 – 2019-12-15 (×9): 100 mg via ORAL
  Filled 2019-12-11 (×10): qty 1

## 2019-12-11 MED ORDER — SODIUM CHLORIDE 0.9 % IV SOLN: INTRAVENOUS | Status: DC

## 2019-12-11 MED ORDER — 0.9 % SODIUM CHLORIDE (POUR BTL) OPTIME
TOPICAL | Status: DC | PRN
  Administered 2019-12-11: 1000 mL

## 2019-12-11 MED ORDER — METHOCARBAMOL 500 MG IVPB - SIMPLE MED
500.0000 mg | Freq: Four times a day (QID) | INTRAVENOUS | Status: DC | PRN
  Filled 2019-12-11: qty 50

## 2019-12-11 MED ORDER — OXYCODONE HCL 5 MG/5ML PO SOLN: 5.0000 mg | Freq: Once | ORAL | Status: DC | PRN

## 2019-12-11 MED ORDER — CEFAZOLIN SODIUM-DEXTROSE 2-4 GM/100ML-% IV SOLN
2.0000 g | INTRAVENOUS | Status: AC
  Administered 2019-12-11: 11:00:00 2 g via INTRAVENOUS
  Filled 2019-12-11: qty 100

## 2019-12-11 MED ORDER — ASPIRIN 81 MG PO CHEW
81.0000 mg | CHEWABLE_TABLET | Freq: Two times a day (BID) | ORAL | Status: DC
  Administered 2019-12-11 – 2019-12-16 (×10): 81 mg via ORAL
  Filled 2019-12-11 (×10): qty 1

## 2019-12-11 MED ORDER — BUPIVACAINE HCL (PF) 0.75 % IJ SOLN
INTRAMUSCULAR | Status: DC | PRN
  Administered 2019-12-11: 1.8 mL via INTRATHECAL

## 2019-12-11 MED ORDER — PHENOL 1.4 % MT LIQD: 1.0000 | OROMUCOSAL | Status: DC | PRN

## 2019-12-11 MED ORDER — CEFAZOLIN SODIUM-DEXTROSE 2-4 GM/100ML-% IV SOLN
2.0000 g | Freq: Four times a day (QID) | INTRAVENOUS | Status: AC
  Administered 2019-12-11 – 2019-12-12 (×2): 2 g via INTRAVENOUS
  Filled 2019-12-11 (×2): qty 100

## 2019-12-11 MED ORDER — METOCLOPRAMIDE HCL 5 MG/ML IJ SOLN: 5.0000 mg | Freq: Three times a day (TID) | INTRAMUSCULAR | Status: DC | PRN

## 2019-12-11 MED ORDER — BISACODYL 10 MG RE SUPP: 10.0000 mg | Freq: Every day | RECTAL | Status: DC | PRN

## 2019-12-11 MED ORDER — DEXAMETHASONE SODIUM PHOSPHATE 10 MG/ML IJ SOLN
10.0000 mg | Freq: Once | INTRAMUSCULAR | Status: AC
  Administered 2019-12-12: 10 mg via INTRAVENOUS
  Filled 2019-12-11: qty 1

## 2019-12-11 MED ORDER — TRANEXAMIC ACID-NACL 1000-0.7 MG/100ML-% IV SOLN
1000.0000 mg | Freq: Once | INTRAVENOUS | Status: AC
  Administered 2019-12-11: 1000 mg via INTRAVENOUS
  Filled 2019-12-11: qty 100

## 2019-12-11 MED ORDER — KETOROLAC TROMETHAMINE 30 MG/ML IJ SOLN
INTRAMUSCULAR | Status: DC | PRN
  Administered 2019-12-11: 30 mg

## 2019-12-11 MED ORDER — FENTANYL CITRATE (PF) 100 MCG/2ML IJ SOLN: 25.0000 ug | INTRAMUSCULAR | Status: DC | PRN

## 2019-12-11 MED ORDER — SODIUM CHLORIDE (PF) 0.9 % IJ SOLN
INTRAMUSCULAR | Status: DC | PRN
  Administered 2019-12-11: 30 mL

## 2019-12-11 MED ORDER — ACETAMINOPHEN 500 MG PO TABS: 1000.0000 mg | ORAL_TABLET | Freq: Once | ORAL | Status: DC | PRN

## 2019-12-11 MED ORDER — ONDANSETRON HCL 4 MG/2ML IJ SOLN: 4.0000 mg | Freq: Four times a day (QID) | INTRAMUSCULAR | Status: DC | PRN

## 2019-12-11 MED ORDER — HYDROCODONE-ACETAMINOPHEN 7.5-325 MG PO TABS
1.0000 | ORAL_TABLET | ORAL | Status: DC | PRN
  Administered 2019-12-14: 1 via ORAL
  Filled 2019-12-11 (×2): qty 1

## 2019-12-11 MED ORDER — ACETAMINOPHEN 325 MG PO TABS: 325.0000 mg | ORAL_TABLET | Freq: Four times a day (QID) | ORAL | Status: DC | PRN

## 2019-12-11 MED ORDER — SODIUM CHLORIDE 0.9 % IR SOLN
Status: DC | PRN
  Administered 2019-12-11: 1000 mL

## 2019-12-11 MED ORDER — STERILE WATER FOR IRRIGATION IR SOLN
Status: DC | PRN
  Administered 2019-12-11: 1000 mL

## 2019-12-11 MED ORDER — MENTHOL 3 MG MT LOZG
1.0000 | LOZENGE | OROMUCOSAL | Status: DC | PRN
  Filled 2019-12-11: qty 9

## 2019-12-11 MED ORDER — ALUM & MAG HYDROXIDE-SIMETH 200-200-20 MG/5ML PO SUSP: 15.0000 mL | ORAL | Status: DC | PRN

## 2019-12-11 MED ORDER — CHLORHEXIDINE GLUCONATE 0.12 % MT SOLN
15.0000 mL | Freq: Once | OROMUCOSAL | Status: AC
  Administered 2019-12-11: 15 mL via OROMUCOSAL

## 2019-12-11 MED ORDER — SODIUM CHLORIDE (PF) 0.9 % IJ SOLN
INTRAMUSCULAR | Status: AC
  Filled 2019-12-11: qty 50

## 2019-12-11 MED ORDER — ACETAMINOPHEN 10 MG/ML IV SOLN: 1000.0000 mg | Freq: Once | INTRAVENOUS | Status: DC | PRN

## 2019-12-11 MED ORDER — FENTANYL CITRATE (PF) 100 MCG/2ML IJ SOLN
50.0000 ug | INTRAMUSCULAR | Status: DC
  Administered 2019-12-11 (×2): 50 ug via INTRAVENOUS
  Filled 2019-12-11: qty 2

## 2019-12-11 MED ORDER — METHOCARBAMOL 500 MG PO TABS
500.0000 mg | ORAL_TABLET | Freq: Four times a day (QID) | ORAL | Status: DC | PRN
  Administered 2019-12-12 – 2019-12-16 (×9): 500 mg via ORAL
  Filled 2019-12-11 (×9): qty 1

## 2019-12-11 MED ORDER — KETOROLAC TROMETHAMINE 30 MG/ML IJ SOLN
INTRAMUSCULAR | Status: AC
  Filled 2019-12-11: qty 1

## 2019-12-11 MED ORDER — MAGNESIUM CITRATE PO SOLN: 1.0000 | Freq: Once | ORAL | Status: DC | PRN

## 2019-12-11 MED ORDER — ACETAMINOPHEN 160 MG/5ML PO SOLN: 1000.0000 mg | Freq: Once | ORAL | Status: DC | PRN

## 2019-12-11 MED ORDER — DEXAMETHASONE SODIUM PHOSPHATE 10 MG/ML IJ SOLN
10.0000 mg | Freq: Once | INTRAMUSCULAR | Status: AC
  Administered 2019-12-11: 11:00:00 10 mg via INTRAVENOUS

## 2019-12-11 MED ORDER — BUPIVACAINE-EPINEPHRINE (PF) 0.25% -1:200000 IJ SOLN
INTRAMUSCULAR | Status: AC
  Filled 2019-12-11: qty 30

## 2019-12-11 MED ORDER — ONDANSETRON HCL 4 MG/2ML IJ SOLN
INTRAMUSCULAR | Status: DC | PRN
  Administered 2019-12-11: 4 mg via INTRAVENOUS

## 2019-12-11 MED ORDER — HYDRALAZINE HCL 20 MG/ML IJ SOLN
INTRAMUSCULAR | Status: DC | PRN
Start: 2019-12-11 — End: 2019-12-11
  Administered 2019-12-11: 4 mg via INTRAVENOUS

## 2019-12-11 MED ORDER — OXYCODONE HCL 5 MG PO TABS: 5.0000 mg | ORAL_TABLET | Freq: Once | ORAL | Status: DC | PRN

## 2019-12-11 MED ORDER — DIPHENHYDRAMINE HCL 12.5 MG/5ML PO ELIX: 12.5000 mg | ORAL_SOLUTION | ORAL | Status: DC | PRN

## 2019-12-11 MED ORDER — PROPOFOL 500 MG/50ML IV EMUL
INTRAVENOUS | Status: DC | PRN
  Administered 2019-12-11: 75 ug/kg/min via INTRAVENOUS

## 2019-12-11 MED ORDER — ROPIVACAINE HCL 7.5 MG/ML IJ SOLN
INTRAMUSCULAR | Status: DC | PRN
Start: 2019-12-11 — End: 2019-12-11
  Administered 2019-12-11: 20 mL via PERINEURAL

## 2019-12-11 MED ORDER — FERROUS SULFATE 325 (65 FE) MG PO TABS
325.0000 mg | ORAL_TABLET | Freq: Two times a day (BID) | ORAL | Status: DC
  Administered 2019-12-12 – 2019-12-16 (×8): 325 mg via ORAL
  Filled 2019-12-11 (×7): qty 1

## 2019-12-11 MED ORDER — FENTANYL CITRATE (PF) 100 MCG/2ML IJ SOLN
INTRAMUSCULAR | Status: AC
  Filled 2019-12-11: qty 2

## 2019-12-11 MED ORDER — POLYETHYLENE GLYCOL 3350 17 G PO PACK
17.0000 g | PACK | Freq: Two times a day (BID) | ORAL | Status: DC
  Administered 2019-12-11 – 2019-12-15 (×7): 17 g via ORAL
  Filled 2019-12-11 (×9): qty 1

## 2019-12-11 MED ORDER — HYDRALAZINE HCL 20 MG/ML IJ SOLN
10.0000 mg | Freq: Once | INTRAMUSCULAR | Status: AC
  Administered 2019-12-11: 10 mg via INTRAVENOUS

## 2019-12-11 MED ORDER — CELECOXIB 200 MG PO CAPS
200.0000 mg | ORAL_CAPSULE | Freq: Two times a day (BID) | ORAL | Status: DC
  Administered 2019-12-11 – 2019-12-16 (×10): 200 mg via ORAL
  Filled 2019-12-11 (×10): qty 1

## 2019-12-11 MED ORDER — HYDRALAZINE HCL 20 MG/ML IJ SOLN
INTRAMUSCULAR | Status: AC
  Filled 2019-12-11: qty 1

## 2019-12-11 MED ORDER — LACTATED RINGERS IV SOLN: INTRAVENOUS | Status: DC

## 2019-12-11 MED ORDER — TRANEXAMIC ACID-NACL 1000-0.7 MG/100ML-% IV SOLN
1000.0000 mg | INTRAVENOUS | Status: AC
  Administered 2019-12-11: 11:00:00 1000 mg via INTRAVENOUS
  Filled 2019-12-11: qty 100

## 2019-12-11 MED ORDER — ORAL CARE MOUTH RINSE: 15.0000 mL | Freq: Once | OROMUCOSAL | Status: AC

## 2019-12-11 MED ORDER — HYDROCODONE-ACETAMINOPHEN 5-325 MG PO TABS
1.0000 | ORAL_TABLET | ORAL | Status: DC | PRN
  Administered 2019-12-11 – 2019-12-14 (×9): 2 via ORAL
  Administered 2019-12-14: 1 via ORAL
  Administered 2019-12-14 – 2019-12-15 (×3): 2 via ORAL
  Administered 2019-12-15: 1 via ORAL
  Administered 2019-12-15: 2 via ORAL
  Administered 2019-12-16: 1 via ORAL
  Filled 2019-12-11: qty 1
  Filled 2019-12-11: qty 2
  Filled 2019-12-11: qty 1
  Filled 2019-12-11 (×2): qty 2
  Filled 2019-12-11: qty 1
  Filled 2019-12-11 (×10): qty 2

## 2019-12-11 MED ORDER — POVIDONE-IODINE 10 % EX SWAB
2.0000 "application " | Freq: Once | CUTANEOUS | Status: AC
  Administered 2019-12-11: 2 via TOPICAL

## 2019-12-11 MED ORDER — ONDANSETRON HCL 4 MG PO TABS: 4.0000 mg | ORAL_TABLET | Freq: Four times a day (QID) | ORAL | Status: DC | PRN

## 2019-12-11 MED ORDER — METOCLOPRAMIDE HCL 5 MG PO TABS: 5.0000 mg | ORAL_TABLET | Freq: Three times a day (TID) | ORAL | Status: DC | PRN

## 2019-12-11 SURGICAL SUPPLY — 64 items
ATTUNE MED ANAT PAT 41 KNEE (Knees) ×2 IMPLANT
ATTUNE MED ANAT PAT 41MM KNEE (Knees) ×1 IMPLANT
ATTUNE PS FEM RT SZ 7 CEM KNEE (Femur) ×3 IMPLANT
ATTUNE PSRP INSR SZ7 7 KNEE (Insert) ×2 IMPLANT
ATTUNE PSRP INSR SZ7 7MM KNEE (Insert) ×1 IMPLANT
BAG ZIPLOCK 12X15 (MISCELLANEOUS) IMPLANT
BASE TIBIAL ROT PLAT SZ 7 KNEE (Knees) ×1 IMPLANT
BLADE SAW SGTL 11.0X1.19X90.0M (BLADE) IMPLANT
BLADE SAW SGTL 13.0X1.19X90.0M (BLADE) ×3 IMPLANT
BLADE SURG SZ10 CARB STEEL (BLADE) ×6 IMPLANT
BNDG ELASTIC 6X10 VLCR STRL LF (GAUZE/BANDAGES/DRESSINGS) ×3 IMPLANT
BNDG ELASTIC 6X5.8 VLCR STR LF (GAUZE/BANDAGES/DRESSINGS) ×3 IMPLANT
BOWL SMART MIX CTS (DISPOSABLE) ×3 IMPLANT
CEMENT HV SMART SET (Cement) ×6 IMPLANT
COVER SURGICAL LIGHT HANDLE (MISCELLANEOUS) ×3 IMPLANT
COVER WAND RF STERILE (DRAPES) IMPLANT
CUFF TOURN SGL QUICK 34 (TOURNIQUET CUFF) ×3
CUFF TRNQT CYL 34X4.125X (TOURNIQUET CUFF) ×1 IMPLANT
DECANTER SPIKE VIAL GLASS SM (MISCELLANEOUS) ×6 IMPLANT
DERMABOND ADVANCED (GAUZE/BANDAGES/DRESSINGS) ×2
DERMABOND ADVANCED .7 DNX12 (GAUZE/BANDAGES/DRESSINGS) ×1 IMPLANT
DRAPE U-SHAPE 47X51 STRL (DRAPES) ×3 IMPLANT
DRESSING AQUACEL AG SP 3.5X10 (GAUZE/BANDAGES/DRESSINGS) ×1 IMPLANT
DRSG AQUACEL AG ADV 3.5X10 (GAUZE/BANDAGES/DRESSINGS) ×3 IMPLANT
DRSG AQUACEL AG SP 3.5X10 (GAUZE/BANDAGES/DRESSINGS) ×3
DURAPREP 26ML APPLICATOR (WOUND CARE) ×6 IMPLANT
ELECT REM PT RETURN 15FT ADLT (MISCELLANEOUS) ×3 IMPLANT
GLOVE BIO SURGEON STRL SZ 6 (GLOVE) ×3 IMPLANT
GLOVE BIOGEL PI IND STRL 6.5 (GLOVE) ×1 IMPLANT
GLOVE BIOGEL PI IND STRL 7.5 (GLOVE) ×1 IMPLANT
GLOVE BIOGEL PI IND STRL 8.5 (GLOVE) ×1 IMPLANT
GLOVE BIOGEL PI INDICATOR 6.5 (GLOVE) ×2
GLOVE BIOGEL PI INDICATOR 7.5 (GLOVE) ×2
GLOVE BIOGEL PI INDICATOR 8.5 (GLOVE) ×2
GLOVE ECLIPSE 8.0 STRL XLNG CF (GLOVE) ×3 IMPLANT
GLOVE ORTHO TXT STRL SZ7.5 (GLOVE) ×3 IMPLANT
GOWN STRL REUS W/ TWL LRG LVL3 (GOWN DISPOSABLE) ×1 IMPLANT
GOWN STRL REUS W/TWL 2XL LVL3 (GOWN DISPOSABLE) ×3 IMPLANT
GOWN STRL REUS W/TWL LRG LVL3 (GOWN DISPOSABLE) ×6 IMPLANT
HANDPIECE INTERPULSE COAX TIP (DISPOSABLE) ×3
HOLDER FOLEY CATH W/STRAP (MISCELLANEOUS) IMPLANT
KIT TURNOVER KIT A (KITS) IMPLANT
MANIFOLD NEPTUNE II (INSTRUMENTS) ×3 IMPLANT
NDL SAFETY ECLIPSE 18X1.5 (NEEDLE) IMPLANT
NEEDLE HYPO 18GX1.5 SHARP (NEEDLE)
NS IRRIG 1000ML POUR BTL (IV SOLUTION) ×3 IMPLANT
PACK TOTAL KNEE CUSTOM (KITS) ×3 IMPLANT
PENCIL SMOKE EVACUATOR (MISCELLANEOUS) IMPLANT
PIN DRILL FIX HALF THREAD (BIT) ×3 IMPLANT
PIN FIX SIGMA LCS THRD HI (PIN) ×3 IMPLANT
PROTECTOR NERVE ULNAR (MISCELLANEOUS) ×3 IMPLANT
SET HNDPC FAN SPRY TIP SCT (DISPOSABLE) ×1 IMPLANT
SET PAD KNEE POSITIONER (MISCELLANEOUS) ×3 IMPLANT
SUT MNCRL AB 4-0 PS2 18 (SUTURE) ×3 IMPLANT
SUT STRATAFIX PDS+ 0 24IN (SUTURE) ×3 IMPLANT
SUT VIC AB 1 CT1 36 (SUTURE) ×3 IMPLANT
SUT VIC AB 2-0 CT1 27 (SUTURE) ×9
SUT VIC AB 2-0 CT1 TAPERPNT 27 (SUTURE) ×3 IMPLANT
SYR 3ML LL SCALE MARK (SYRINGE) ×3 IMPLANT
TIBIAL BASE ROT PLAT SZ 7 KNEE (Knees) ×3 IMPLANT
TRAY FOLEY MTR SLVR 16FR STAT (SET/KITS/TRAYS/PACK) ×3 IMPLANT
WATER STERILE IRR 1000ML POUR (IV SOLUTION) ×6 IMPLANT
WRAP KNEE MAXI GEL POST OP (GAUZE/BANDAGES/DRESSINGS) ×3 IMPLANT
YANKAUER SUCT BULB TIP 10FT TU (MISCELLANEOUS) ×3 IMPLANT

## 2019-12-11 NOTE — Anesthesia Postprocedure Evaluation (Signed)
Anesthesia Post Note  Patient: Travis Mcconnell  Procedure(s) Performed: TOTAL KNEE ARTHROPLASTY (Right Knee)     Patient location during evaluation: PACU Anesthesia Type: MAC, Spinal and Regional Level of consciousness: awake and alert Pain management: pain level controlled Vital Signs Assessment: post-procedure vital signs reviewed and stable Respiratory status: spontaneous breathing, nonlabored ventilation, respiratory function stable and patient connected to nasal cannula oxygen Cardiovascular status: stable and blood pressure returned to baseline Postop Assessment: no apparent nausea or vomiting and spinal receding Anesthetic complications: no   No complications documented.  Last Vitals:  Vitals:   12/11/19 1502 12/11/19 1605  BP: (!) 152/88 (!) 155/97  Pulse: 88 90  Resp: 16 17  Temp: 36.4 C 36.6 C  SpO2: 100% 100%    Last Pain:  Vitals:   12/11/19 1605  TempSrc: Oral  PainSc:                  [name]

## 2019-12-11 NOTE — Discharge Instructions (Signed)

## 2019-12-11 NOTE — Anesthesia Procedure Notes (Signed)
Anesthesia Regional Block: Adductor canal block   Pre-Anesthetic Checklist: ,, timeout performed, Correct Patient, Correct Site, Correct Laterality, Correct Procedure, Correct Position, site marked, Risks and benefits discussed,  Surgical consent,  Pre-op evaluation,  At surgeon's request and post-op pain management  Laterality: Right and Lower  Prep: chloraprep       Needles:  Injection technique: Single-shot     Needle Length: 9cm  Needle Gauge: 22     Additional Needles: Arrow StimuQuik ECHO Echogenic Stimulating PNB Needle  Procedures:,,,, ultrasound used (permanent image in chart),,,,  Narrative:  Start time: 12/11/2019 10:44 AM End time: 12/11/2019 10:47 AM Injection made incrementally with aspirations every 5 mL.  Performed by: Personally  Anesthesiologist: Val Eagle, MD

## 2019-12-11 NOTE — Progress Notes (Signed)
AssistedDr. Moser with right, ultrasound guided, adductor canal block. Side rails up, monitors on throughout procedure. See vital signs in flow sheet. Tolerated Procedure well.  

## 2019-12-11 NOTE — Transfer of Care (Signed)
Immediate Anesthesia Transfer of Care Note  Patient: EBEN CHOINSKI  Procedure(s) Performed: TOTAL KNEE ARTHROPLASTY (Right Knee)  Patient Location: PACU  Anesthesia Type:Spinal  Level of Consciousness: sedated, patient cooperative and responds to stimulation  Airway & Oxygen Therapy: Patient Spontanous Breathing and Patient connected to face mask oxygen  Post-op Assessment: Report given to RN and Post -op Vital signs reviewed and stable  Post vital signs: Reviewed and stable  Last Vitals:  Vitals Value Taken Time  BP 132/94 12/11/19 1256  Temp    Pulse 78 12/11/19 1258  Resp 17 12/11/19 1258  SpO2 100 % 12/11/19 1258  Vitals shown include unvalidated device data.  Last Pain:  Vitals:   12/11/19 0820  TempSrc:   PainSc: 0-No pain      Patients Stated Pain Goal: 3 (12/11/19 0820)  Complications: No complications documented.

## 2019-12-11 NOTE — Anesthesia Preprocedure Evaluation (Addendum)
Anesthesia Evaluation  Patient identified by MRN, date of birth, ID band Patient awake    Reviewed: Allergy & Precautions, NPO status , Patient's Chart, lab work & pertinent test results  History of Anesthesia Complications Negative for: history of anesthetic complications  Airway Mallampati: II  TM Distance: >3 FB Neck ROM: Full    Dental  (+) Dental Advisory Given, Teeth Intact   Pulmonary neg shortness of breath, neg sleep apnea, neg COPD, neg recent URI, former smoker,    breath sounds clear to auscultation       Cardiovascular negative cardio ROS   Rhythm:Regular     Neuro/Psych negative neurological ROS  negative psych ROS   GI/Hepatic negative GI ROS, Neg liver ROS,   Endo/Other  negative endocrine ROS  Renal/GU negative Renal ROS     Musculoskeletal  (+) Arthritis ,   Abdominal   Peds  Hematology plt 184, hgb 14.4   Anesthesia Other Findings   Reproductive/Obstetrics                            Anesthesia Physical Anesthesia Plan  ASA: I  Anesthesia Plan: MAC and Spinal   Post-op Pain Management:    Induction:   PONV Risk Score and Plan: 1 and Treatment may vary due to age or medical condition  Airway Management Planned: Nasal Cannula  Additional Equipment: None  Intra-op Plan:   Post-operative Plan: Extubation in OR  Informed Consent: I have reviewed the patients History and Physical, chart, labs and discussed the procedure including the risks, benefits and alternatives for the proposed anesthesia with the patient or authorized representative who has indicated his/her understanding and acceptance.     Dental advisory given  Plan Discussed with: CRNA and Surgeon  Anesthesia Plan Comments:         Anesthesia Quick Evaluation

## 2019-12-11 NOTE — Evaluation (Signed)
Physical Therapy Evaluation Patient Details Name: Travis Mcconnell MRN: [id number] DOB: [birth year] Today's Date: 12/11/2019   History of Present Illness  Patient is 79 y.o. 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  Clinical Impression  SIGMOND PATALANO is a 79 y.o. male POD 0 s/p Rt TKA. Patient reports independence with mobility using SPC at baseline. Patient is now limited by functional impairments (see PT problem list below) and requires min assist for transfers and gait with RW. Patient was able to ambulate ~10 feet with RW and min assist and manual facilitation of Rt knee extension in stance. Patient instructed in exercise to facilitate ROM and circulation. Patient will benefit from continued skilled PT interventions to address impairments and progress towards PLOF. Acute PT will follow to progress mobility and stair training in preparation for safe discharge home.     Follow Up Recommendations SNF;Other (comment) (pt preference (he lives alone))    Equipment Recommendations  Rolling walker with 5" wheels;3in1 (PT)    Recommendations for Other Services       Precautions / Restrictions Precautions Precautions: Fall Restrictions Weight Bearing Restrictions: No Other Position/Activity Restrictions: WBAT      Mobility  Bed Mobility Overal bed mobility: Needs Assistance Bed Mobility: Supine to Sit     Supine to sit: Min assist;HOB elevated     General bed mobility comments: cues for use of bed rail and assist to bring Rt LE off EOB and raise trunk.   Transfers Overall transfer level: Needs assistance Equipment used: Rolling walker (2 wheeled) Transfers: Sit to/from Stand Sit to Stand: Min assist         General transfer comment: cues for safe technique for power up with RW. min assist to steady and manual facilitation of Rt knee to maintain extension in stance.   Ambulation/Gait Ambulation/Gait assistance: Min assist Gait  Distance (Feet): 10 Feet Assistive device: Rolling walker (2 wheeled) Gait Pattern/deviations: Step-to pattern;Decreased stride length;Decreased stance time - right;Decreased weight shift to right Gait velocity: decr   General Gait Details: cues for safe step pattern and proximity to RW. assist to steady and prevent Rt knee buckling with manual facilitation in stance.   Stairs            Wheelchair Mobility    Modified Rankin (Stroke Patients Only)       Balance Overall balance assessment: Needs assistance Sitting-balance support: Feet supported Sitting balance-Leahy Scale: Good     Standing balance support: During functional activity;Bilateral upper extremity supported Standing balance-Leahy Scale: Fair          Pertinent Vitals/Pain Pain Assessment: 0-10 Pain Score: 5  Pain Location: Rt knee Pain Descriptors / Indicators: Aching;Discomfort;Sore Pain Intervention(s): Limited activity within patient's tolerance;Monitored during session;Repositioned;Ice applied    Home Living Family/patient expects to be discharged to:: Private residence Living Arrangements: Alone Available Help at Discharge:  (none) Type of Home: House Home Access: Stairs to enter Entrance Stairs-Rails: None Entrance Stairs-Number of Steps: 2 Home Layout: One level Home Equipment: Cane - single point      Prior Function Level of Independence: Independent         Comments: pt has been reliant on [facility] for mobility since injuring knees in [employer].     Hand Dominance   Dominant Hand: Right    Extremity/Trunk Assessment   Upper Extremity Assessment Upper Extremity Assessment: Overall WFL for tasks assessed    Lower Extremity Assessment Lower Extremity Assessment:  RLE deficits/detail RLE Deficits / Details: poor quad activation. RLE Sensation: WNL RLE Coordination: WNL    Cervical / Trunk Assessment Cervical / Trunk Assessment: Normal  Communication   Communication: No  difficulties  Cognition Arousal/Alertness: Awake/alert Behavior During Therapy: WFL for tasks assessed/performed Overall Cognitive Status: Within Functional Limits for tasks assessed           General Comments      Exercises Total Joint Exercises Ankle Circles/Pumps: AROM;Both;20 reps;Seated Quad Sets: AROM;Right;10 reps;Seated Heel Slides: AAROM;Right;10 reps;Seated   Assessment/Plan    PT Assessment Patient needs continued PT services  PT Problem List Decreased strength;Decreased range of motion;Decreased activity tolerance;Decreased balance;Decreased mobility;Decreased knowledge of use of DME       PT Treatment Interventions DME instruction;Gait training;Stair training;Therapeutic activities;Functional mobility training;Balance training;Therapeutic exercise;Patient/family education    PT Goals (Current goals can be found in the Care Plan section)  Acute Rehab PT Goals Patient Stated Goal: to get into rehab somewhere before returning home PT Goal Formulation: With patient Time For Goal Achievement: 12/18/19 Potential to Achieve Goals: Good    Frequency 7X/week   Barriers to discharge Decreased caregiver support pt lives alone and does not have family/friends to assist. He is concerned about going home alone and has a strong preference to begin rehab at [facility].       AM-PAC PT "6 Clicks" Mobility  Outcome Measure Help needed turning from your back to your side while in a flat bed without using bedrails?: A Little Help needed moving from lying on your back to sitting on the side of a flat bed without using bedrails?: A Little Help needed moving to and from a bed to a chair (including a wheelchair)?: A Little Help needed standing up from a chair using your arms (e.g., wheelchair or bedside chair)?: A Little Help needed to walk in hospital room?: A Little Help needed climbing 3-5 steps with a railing? : A Lot 6 Click Score: 17    End of Session Equipment Utilized During  Treatment: Gait belt;Right knee immobilizer (Rt knee immobilizer placed at EOS) Activity Tolerance: Patient tolerated treatment well Patient left: in chair;with call bell/phone within reach;with chair alarm set Nurse Communication: Mobility status PT Visit Diagnosis: Muscle weakness (generalized) (M62.81);Difficulty in walking, not elsewhere classified (R26.2)    Time: 1031-5945 PT Time Calculation (min) (ACUTE ONLY): 25 min   Charges:   PT Evaluation $PT Eval Low Complexity: 1 Low PT Treatments $Therapeutic Exercise: 8-22 mins        Wynn Maudlin, DPT Acute Rehabilitation Services  Office [phone] Pager [phone]  12/11/2019 5:38 PM

## 2019-12-11 NOTE — Anesthesia Procedure Notes (Signed)
Spinal  Start time: 12/11/2019 11:00 AM End time: 12/11/2019 11:05 AM Staffing Performed: resident/CRNA  Resident/CRNA: Kizzie Fantasia, CRNA Preanesthetic Checklist Completed: patient identified, IV checked, site marked, risks and benefits discussed, surgical consent, monitors and equipment checked, pre-op evaluation and timeout performed Spinal Block Patient position: sitting Prep: DuraPrep Patient monitoring: heart rate, continuous pulse ox and blood pressure Approach: midline Location: L4-5 Injection technique: single-shot Needle Needle type: Pencan  Needle gauge: 24 G Needle length: 9 cm Needle insertion depth: 7 cm Additional Notes Pt sitting position, sterile prep and drape, negative paresthesia/heme

## 2019-12-11 NOTE — Interval H&P Note (Signed)
History and Physical Interval Note:  12/11/2019 9:51 AM  Travis Mcconnell  has presented today for surgery, with the diagnosis of Right knee osteoarthritis.  The various methods of treatment have been discussed with the patient and family. After consideration of risks, benefits and other options for treatment, the patient has consented to  Procedure(s) with comments: TOTAL KNEE ARTHROPLASTY (Right) - 70 mins as a surgical intervention.  The patient's history has been reviewed, patient examined, no change in status, stable for surgery.  I have reviewed the patient's chart and labs.  Questions were answered to the patient's satisfaction.     Shelda Pal

## 2019-12-11 NOTE — Op Note (Signed)
NAME:  Travis Mcconnell                      MEDICAL RECORD NO.:  [id number]                             FACILITY:  [facility]      PHYSICIAN:  Madlyn Frankel. Charlann Boxer, M.D.  DATE OF BIRTH:  1940/10/17      DATE OF PROCEDURE:  12/11/2019                                     OPERATIVE REPORT         PREOPERATIVE DIAGNOSIS:  Right knee osteoarthritis.      POSTOPERATIVE DIAGNOSIS:  Right knee osteoarthritis.      FINDINGS:  The patient was noted to have complete loss of cartilage and   bone-on-bone arthritis with associated osteophytes in the medial and patellofemoral compartments of   the knee with a significant synovitis and associated effusion.  The patient had failed months of conservative treatment including medications, injection therapy, activity modification.     PROCEDURE:  Right total knee replacement.      COMPONENTS USED:  DePuy Attune rotating platform posterior stabilized knee   system, a size 7 femur, 7 tibia, size 7 mm PS AOX insert, and 41 anatomic patellar   button.      SURGEON:  Madlyn Frankel. Charlann Boxer, M.D.      ASSISTANT:  Dennie Bible, PA-C.      ANESTHESIA:  Regional and Spinal.      SPECIMENS:  None.      COMPLICATION:  None.      DRAINS:  None.  EBL: <150cc      TOURNIQUET TIME:  36 min at 250 mmHg     The patient was stable to the recovery room.      INDICATION FOR PROCEDURE:  Travis Mcconnell is a 79 y.o. male patient of   mine.  The patient had been seen, evaluated, and treated for months conservatively in the   office with medication, activity modification, and injections.  The patient had   radiographic changes of bone-on-bone arthritis with endplate sclerosis and osteophytes noted.  Based on the radiographic changes and failed conservative measures, the patient   decided to proceed with definitive treatment, total knee replacement.  Risks of infection, DVT, component failure, need for revision surgery, neurovascular injury were reviewed in the office setting.  The  postop course was reviewed stressing the efforts to maximize post-operative satisfaction and function.  Consent was obtained for benefit of pain   relief.      PROCEDURE IN DETAIL:  The patient was brought to the operative theater.   Once adequate anesthesia, preoperative antibiotics, 2 gm of Ancef,1 gm of Tranexamic Acid, and 10 mg of Decadron administered, the patient was positioned supine with a right thigh tourniquet placed.  The  right lower extremity was prepped and draped in sterile fashion.  A time-   out was performed identifying the patient, planned procedure, and the appropriate extremity.      The right lower extremity was placed in the [facility] leg holder.  The leg was   exsanguinated, tourniquet elevated to 250 mmHg.  A midline incision was   made followed by median parapatellar arthrotomy.  Following initial   exposure, attention was first  directed to the patella.  Precut   measurement was noted to be 26 mm.  I resected down to 14 mm and used a   41 anatomic patellar button to restore patellar height as well as cover the cut surface.      The lug holes were drilled and a metal shim was placed to protect the   patella from retractors and saw blade during the procedure.      At this point, attention was now directed to the femur.  The femoral   canal was opened with a drill, irrigated to try to prevent fat emboli.  An   intramedullary rod was passed at 5 degrees valgus, 9 mm of bone was   resected off the distal femur.  Following this resection, the tibia was   subluxated anteriorly.  Using the extramedullary guide, 2 mm of bone was resected off   the proximal medial tibia.  We confirmed the gap would be   stable medially and laterally with a size 5 spacer block as well as confirmed that the tibial cut was perpendicular in the coronal plane, checking with an alignment rod.      Once this was done, I sized the femur to be a size 7 in the anterior-   posterior dimension, chose a  standard component based on medial and   lateral dimension.  The size 7 rotation block was then pinned in   position anterior referenced using the C-clamp to set rotation.  The   anterior, posterior, and  chamfer cuts were made without difficulty nor   notching making certain that I was along the anterior cortex to help   with flexion gap stability.      The final box cut was made off the lateral aspect of distal femur.      At this point, the tibia was sized to be a size 7.  The size 7 tray was   then pinned in position through the medial third of the tubercle,   drilled, and keel punched.  Trial reduction was now carried with a 7 femur,  7 tibia, a size 7 mm PS insert, and the 41 anatomic patella botton.  The knee was brought to full extension with good flexion stability with the patella   tracking through the trochlea without application of pressure.  Given   all these findings the trial components removed.  Final components were   opened and cement was mixed.  The knee was irrigated with normal saline solution and pulse lavage.  The synovial lining was   then injected with 30 cc of 0.25% Marcaine with epinephrine, 1 cc of Toradol and 30 cc of NS for a total of 61 cc.     Final implants were then cemented onto cleaned and dried cut surfaces of bone with the knee brought to extension with a size 7 mm PS trial insert.      Once the cement had fully cured, excess cement was removed   throughout the knee.  I confirmed that I was satisfied with the range of   motion and stability, and the final size 7 mm PS AOX insert was chosen.  It was   placed into the knee.      The tourniquet had been let down at 36 minutes.  No significant   hemostasis was required.  The extensor mechanism was then reapproximated using #1 Vicryl and #1 Stratafix sutures with the knee   in flexion.  The  remaining wound was closed with 2-0 Vicryl and running 4-0 Monocryl.   The knee was cleaned, dried, dressed  sterilely using Dermabond and   Aquacel dressing.  The patient was then   brought to recovery room in stable condition, tolerating the procedure   well.   Please note that Physician Assistant, Dennie Bible, PA-C was present for the entirety of the case, and was utilized for pre-operative positioning, peri-operative retractor management, general facilitation of the procedure and for primary wound closure at the end of the case.              Madlyn Frankel Charlann Boxer, M.D.    12/11/2019 11:07 AM

## 2019-12-12 ENCOUNTER — Encounter (HOSPITAL_COMMUNITY): Payer: Self-pay | Admitting: Orthopedic Surgery

## 2019-12-12 LAB — CBC
HCT: 39.3 % (ref 39.0–52.0)
Hemoglobin: 12.8 g/dL — ABNORMAL LOW (ref 13.0–17.0)
MCH: 28.7 pg (ref 26.0–34.0)
MCHC: 32.6 g/dL (ref 30.0–36.0)
MCV: 88.1 fL (ref 80.0–100.0)
Platelets: 170 10*3/uL (ref 150–400)
RBC: 4.46 MIL/uL (ref 4.22–5.81)
RDW: 14.1 % (ref 11.5–15.5)
WBC: 10.2 10*3/uL (ref 4.0–10.5)
nRBC: 0 % (ref 0.0–0.2)

## 2019-12-12 LAB — BASIC METABOLIC PANEL
Anion gap: 8 (ref 5–15)
BUN: 16 mg/dL (ref 8–23)
CO2: 23 mmol/L (ref 22–32)
Calcium: 8.7 mg/dL — ABNORMAL LOW (ref 8.9–10.3)
Chloride: 104 mmol/L (ref 98–111)
Creatinine, Ser: 1.14 mg/dL (ref 0.61–1.24)
GFR calc Af Amer: >60 mL/min (ref >60)
GFR calc non Af Amer: >60 mL/min (ref >60)
Glucose, Bld: 109 mg/dL — ABNORMAL HIGH (ref 70–99)
Potassium: 4.6 mmol/L (ref 3.5–5.1)
Sodium: 135 mmol/L (ref 135–145)

## 2019-12-12 LAB — NO BLOOD PRODUCTS

## 2019-12-12 MED ORDER — ASPIRIN 81 MG PO CHEW: 81.0000 mg | CHEWABLE_TABLET | Freq: Two times a day (BID) | ORAL | 0 refills | Status: AC

## 2019-12-12 MED ORDER — POLYETHYLENE GLYCOL 3350 17 G PO PACK
17.0000 g | PACK | Freq: Two times a day (BID) | ORAL | 0 refills | Status: DC
Start: 2019-12-12 — End: 2020-10-20

## 2019-12-12 MED ORDER — DOCUSATE SODIUM 100 MG PO CAPS
100.0000 mg | ORAL_CAPSULE | Freq: Two times a day (BID) | ORAL | 0 refills | Status: DC
Start: 2019-12-12 — End: 2020-10-20

## 2019-12-12 MED ORDER — FERROUS SULFATE 325 (65 FE) MG PO TABS: 325.0000 mg | ORAL_TABLET | Freq: Three times a day (TID) | ORAL | 0 refills | Status: DC

## 2019-12-12 MED ORDER — HYDROCODONE-ACETAMINOPHEN 5-325 MG PO TABS: 1.0000 | ORAL_TABLET | ORAL | 0 refills | Status: AC | PRN

## 2019-12-12 MED ORDER — METHOCARBAMOL 500 MG PO TABS: 500.0000 mg | ORAL_TABLET | Freq: Four times a day (QID) | ORAL | 0 refills | Status: AC | PRN

## 2019-12-12 NOTE — Progress Notes (Signed)
Physical Therapy Treatment Patient Details Name: Travis Mcconnell MRN: [id number] DOB: 13-Jun-1940 Today's Date: 12/12/2019    History of Present Illness Patient is 79 y.o. 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    PT Comments    Pt performed therex program with assist and with notable improvement in ROM from am session.   Follow Up Recommendations  SNF     Equipment Recommendations  Rolling walker with 5" wheels;3in1 (PT)    Recommendations for Other Services Rehab consult     Precautions / Restrictions Precautions Precautions: Fall;Knee Restrictions Weight Bearing Restrictions: No Other Position/Activity Restrictions: WBAT    Mobility  Bed Mobility Overal bed mobility: Needs Assistance Bed Mobility: Sit to Supine     Supine to sit: Min assist;HOB elevated Sit to supine: Min assist   General bed mobility comments: cues for sequence and use of L LE to self assist  Transfers Overall transfer level: Needs assistance Equipment used: Rolling walker (2 wheeled) Transfers: Sit to/from Stand Sit to Stand: Min guard         General transfer comment: cues for LE management and use of UEs to self assist  Ambulation/Gait Ambulation/Gait assistance: Min guard Gait Distance (Feet): 56 Feet (twice) Assistive device: Rolling walker (2 wheeled) Gait Pattern/deviations: Step-to pattern;Decreased stride length;Decreased stance time - right;Decreased weight shift to right Gait velocity: decr   General Gait Details: cues for sequence, posture and position from [employer]             Wheelchair Mobility    Modified Rankin (Stroke Patients Only)       Balance Overall balance assessment: Needs assistance Sitting-balance support: Feet supported Sitting balance-Leahy Scale: Good     Standing balance support: During functional activity;Bilateral upper extremity supported Standing balance-Leahy Scale: Fair                               Cognition Arousal/Alertness: Awake/alert Behavior During Therapy: WFL for tasks assessed/performed Overall Cognitive Status: Within Functional Limits for tasks assessed                                        Exercises Total Joint Exercises Ankle Circles/Pumps: AROM;Both;20 reps;Seated Quad Sets: AROM;Right;10 reps;Seated Heel Slides: AAROM;Right;15 reps;Supine Straight Leg Raises: AAROM;Right;15 reps;Supine Goniometric ROM: AAROM R knee -8 - 80    General Comments        Pertinent Vitals/Pain Pain Assessment: 0-10 Pain Score: 6  Pain Location: Rt knee Pain Descriptors / Indicators: Aching;Discomfort;Sore Pain Intervention(s): Limited activity within patient's tolerance;Monitored during session;Premedicated before session;Ice applied    Home Living                      Prior Function            PT Goals (current goals can now be found in the care plan section) Acute Rehab PT Goals Patient Stated Goal: to get into rehab somewhere before returning home PT Goal Formulation: With patient Time For Goal Achievement: 12/18/19 Potential to Achieve Goals: Good Progress towards PT goals: Progressing toward goals    Frequency    7X/week      PT Plan Current plan remains appropriate    Co-evaluation  AM-PAC PT "6 Clicks" Mobility   Outcome Measure  Help needed turning from your back to your side while in a flat bed without using bedrails?: A Little Help needed moving from lying on your back to sitting on the side of a flat bed without using bedrails?: A Little Help needed moving to and from a bed to a chair (including a wheelchair)?: A Little Help needed standing up from a chair using your arms (e.g., wheelchair or bedside chair)?: A Little Help needed to walk in hospital room?: A Little Help needed climbing 3-5 steps with a railing? : A Lot 6 Click Score: 17    End of Session  Equipment Utilized During Treatment: Gait belt;Right knee immobilizer Activity Tolerance: Patient tolerated treatment well Patient left: in bed;with call bell/phone within reach;with bed alarm set Nurse Communication: Mobility status PT Visit Diagnosis: Muscle weakness (generalized) (M62.81);Difficulty in walking, not elsewhere classified (R26.2)     Time: 0233-4356 PT Time Calculation (min) (ACUTE ONLY): 18 min  Charges:  $Gait Training: 8-22 mins $Therapeutic Exercise: 8-22 mins                     [name] PT Acute Rehabilitation Services Pager [phone] Office [phone]    [facility] 12/12/2019, 4:39 PM

## 2019-12-12 NOTE — Progress Notes (Signed)
Physical Therapy Treatment Patient Details Name: Travis Mcconnell MRN: [id number] DOB: 1940/07/28 Today's Date: 12/12/2019    History of Present Illness Patient is 79 y.o. 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    PT Comments    Pt with marked improvement in activity tolerance this date and progressing as expected with mobility.  Follow Up Recommendations  SNF;Other (comment) (pt preference as he has no assist at home)     Equipment Recommendations  Rolling walker with 5" wheels;3in1 (PT)    Recommendations for Other Services       Precautions / Restrictions Precautions Precautions: Fall;Knee Restrictions Weight Bearing Restrictions: No Other Position/Activity Restrictions: WBAT    Mobility  Bed Mobility Overal bed mobility: Needs Assistance Bed Mobility: Supine to Sit     Supine to sit: Min assist;HOB elevated     General bed mobility comments: cues for sequence and use of L LE to self assist  Transfers Overall transfer level: Needs assistance Equipment used: Rolling walker (2 wheeled) Transfers: Sit to/from Stand Sit to Stand: Min guard         General transfer comment: cues for LE management and use of UEs to self assist  Ambulation/Gait Ambulation/Gait assistance: Min assist;Min guard Gait Distance (Feet): 56 Feet Assistive device: Rolling walker (2 wheeled) Gait Pattern/deviations: Step-to pattern;Decreased stride length;Decreased stance time - right;Decreased weight shift to right Gait velocity: decr   General Gait Details: cues for sequence, posture and position from [employer]             Wheelchair Mobility    Modified Rankin (Stroke Patients Only)       Balance Overall balance assessment: Needs assistance Sitting-balance support: Feet supported Sitting balance-Leahy Scale: Good     Standing balance support: During functional activity;Bilateral upper extremity  supported Standing balance-Leahy Scale: Fair                              Cognition Arousal/Alertness: Awake/alert Behavior During Therapy: WFL for tasks assessed/performed Overall Cognitive Status: Within Functional Limits for tasks assessed                                        Exercises Total Joint Exercises Ankle Circles/Pumps: AROM;Both;20 reps;Seated Quad Sets: AROM;Right;10 reps;Seated Heel Slides: AAROM;Right;15 reps;Supine Straight Leg Raises: AAROM;Right;15 reps;Supine    General Comments        Pertinent Vitals/Pain Pain Assessment: 0-10 Pain Score: 6  Pain Location: Rt knee Pain Descriptors / Indicators: Aching;Discomfort;Sore Pain Intervention(s): Limited activity within patient's tolerance;Monitored during session;Premedicated before session;Ice applied    Home Living                      Prior Function            PT Goals (current goals can now be found in the care plan section) Acute Rehab PT Goals Patient Stated Goal: to get into rehab somewhere before returning home PT Goal Formulation: With patient Time For Goal Achievement: 12/18/19 Potential to Achieve Goals: Good Progress towards PT goals: Progressing toward goals    Frequency    7X/week      PT Plan Current plan remains appropriate    Co-evaluation  AM-PAC PT "6 Clicks" Mobility   Outcome Measure  Help needed turning from your back to your side while in a flat bed without using bedrails?: A Little Help needed moving from lying on your back to sitting on the side of a flat bed without using bedrails?: A Little Help needed moving to and from a bed to a chair (including a wheelchair)?: A Little Help needed standing up from a chair using your arms (e.g., wheelchair or bedside chair)?: A Little Help needed to walk in hospital room?: A Little Help needed climbing 3-5 steps with a railing? : A Lot 6 Click Score: 17    End of  Session Equipment Utilized During Treatment: Gait belt;Right knee immobilizer Activity Tolerance: Patient tolerated treatment well Patient left: in chair;with call bell/phone within reach;with chair alarm set Nurse Communication: Mobility status PT Visit Diagnosis: Muscle weakness (generalized) (M62.81);Difficulty in walking, not elsewhere classified (R26.2)     Time: 3299-2426 PT Time Calculation (min) (ACUTE ONLY): 34 min  Charges:  $Gait Training: 8-22 mins $Therapeutic Exercise: 8-22 mins                     [name] PT Acute Rehabilitation Services Pager [phone] Office [phone]    [name] 12/12/2019, 1:40 PM

## 2019-12-12 NOTE — TOC Initial Note (Signed)
Transition of Care [facility]) - Initial/Assessment Note    Patient Details  Name: Travis Mcconnell MRN: [id number] Date of Birth: 1940-12-02  [facility] CM/SW Contact:    Lennart Pall, LCSW Phone Number: 12/12/2019, 2:42 PM  Clinical Narrative:     Met with pt earlier today to discuss dc plans.  Pt very clearly wants to go to short term SNF prior to home as he has no assistance available for him at home.  He has VA coverage only which may slow the SNF placement process, however, paperwork has been submitted.  Pt does appear to be improving with each therapy session.  Will monitor if SNF continues to be needed while we await VA approval for SNF.  Continue to follow.              Expected Discharge Plan: Skilled Nursing Facility Barriers to Discharge: Continued Medical Work up   Patient Goals and CMS Choice Patient states their goals for this hospitalization and ongoing recovery are:: to go to rehab then home CMS Medicare.gov Compare Post Acute Care list provided to:: Patient Choice offered to / list presented to : Patient  Expected Discharge Plan and Services Expected Discharge Plan: [place] In-house Referral: Clinical Social Work   Post Acute Care Choice: [place] Living arrangements for the past 2 months: [place]                                      Prior Living Arrangements/Services Living arrangements for the past 2 months: Single Family Home Lives with:: Self   Do you feel safe going back to the place where you live?: No   requests SNF prior to home since he has no assistance at home  Need for Family Participation in Patient Care: No (Comment) Care giver support system in place?: No (comment)   Criminal Activity/Legal Involvement Pertinent to Current Situation/Hospitalization: No - Comment as needed  Activities of Daily Living Home Assistive Devices/Equipment: Blood pressure cuff, Eyeglasses ADL Screening  (condition at time of admission) Patient's cognitive ability adequate to safely complete daily activities?: Yes Is the patient deaf or have difficulty hearing?: No Does the patient have difficulty seeing, even when wearing glasses/contacts?: No Does the patient have difficulty concentrating, remembering, or making decisions?: Yes Patient able to express need for assistance with ADLs?: Yes Does the patient have difficulty dressing or bathing?: No Independently performs ADLs?: Yes (appropriate for developmental age) Does the patient have difficulty walking or climbing stairs?: Yes Weakness of Legs: None Weakness of Arms/Hands: None  Permission Sought/Granted Permission sought to share information with : Facility Sport and exercise psychologist, Case Optician, dispensing granted to share information with : Yes, Verbal Permission Granted              Emotional Assessment Appearance:: Appears stated age Attitude/Demeanor/Rapport: Engaged Affect (typically observed): Accepting Orientation: : Oriented to Self, Oriented to Place, Oriented to  Time, Oriented to Situation Alcohol / Substance Use: Not Applicable Psych Involvement: No (comment)  Admission diagnosis:  Status post total right knee replacement [Z96.651] Patient Active Problem List   Diagnosis Date Noted  . Osteoarthritis of right knee 12/11/2019  . 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 [place] [address] Phone: [phone] Fax: [phone]     Social Determinants of Health (SDOH) Interventions    Readmission Risk Interventions No flowsheet data found.

## 2019-12-12 NOTE — Progress Notes (Signed)
     Subjective: 1 Day Post-Op Procedure(s) (LRB): TOTAL KNEE ARTHROPLASTY (Right)   Patient reports pain as mild, pain controlled. No reported events throughout the night.    Discussed the procedure and expectations moving forward.  Plan is to be discharged to SNF.   Anticipated LOS equal to or greater than 2 midnights due to - Age 80 and older with one or more of the following:  - Overweight  - Anticipated need for postoperative skilled nursing care or inpatient rehab   Objective:   VITALS:   Vitals:   12/12/19 0122 12/12/19 0543  BP: 127/90 131/88  Pulse: 77 84  Resp: 18 18  Temp: 98.5 F (36.9 C) 98 F (36.7 C)  SpO2: 99% 97%    Dorsiflexion/Plantar flexion intact Incision: dressing C/D/I No cellulitis present Compartment soft  LABS Recent Labs    12/12/19 0316  HGB 12.8*  HCT 39.3  WBC 10.2  PLT 170    Recent Labs    12/12/19 0316  NA 135  K 4.6  BUN 16  CREATININE 1.14  GLUCOSE 109*     Assessment/Plan: 1 Day Post-Op Procedure(s) (LRB): TOTAL KNEE ARTHROPLASTY (Right) Foley cath d/c'ed Advance diet Up with therapy D/C IV fluids Discharge to SNF when ready   Overweight (BMI 25-29.9) Estimated body mass index is 27.58 kg/m as calculated from the following:   Height as of this encounter: 5' 10.5" (1.791 m).   Weight as of this encounter: 88.5 kg. Patient also counseled that weight may inhibit the healing process Patient counseled that losing weight will help with future health issues     Lanney Gins PA-C  [facility]  [place] [address], [place], [place] [address] Phone: [phone] [email or web address] Facebook  [employer]

## 2019-12-12 NOTE — Progress Notes (Signed)
Physical Therapy Treatment Patient Details Name: Travis Mcconnell MRN: [id number] DOB: 26-Sep-1940 Today's Date: 12/12/2019    History of Present Illness Patient is 79 y.o. 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    PT Comments    Pt very cooperative and progressing steadily with mobility.   Follow Up Recommendations  SNF;Other (comment)     Equipment Recommendations  Rolling walker with 5" wheels;3in1 (PT)    Recommendations for Other Services       Precautions / Restrictions Precautions Precautions: Fall;Knee Restrictions Weight Bearing Restrictions: No Other Position/Activity Restrictions: WBAT    Mobility  Bed Mobility Overal bed mobility: Needs Assistance Bed Mobility: Supine to Sit     Supine to sit: Min assist;HOB elevated     General bed mobility comments: cues for sequence and use of L LE to self assist  Transfers Overall transfer level: Needs assistance Equipment used: Rolling walker (2 wheeled) Transfers: Sit to/from Stand Sit to Stand: Min guard         General transfer comment: cues for LE management and use of UEs to self assist  Ambulation/Gait Ambulation/Gait assistance: Min guard Gait Distance (Feet): 56 Feet (twice) Assistive device: Rolling walker (2 wheeled) Gait Pattern/deviations: Step-to pattern;Decreased stride length;Decreased stance time - right;Decreased weight shift to right Gait velocity: decr   General Gait Details: cues for sequence, posture and position from [employer]             Wheelchair Mobility    Modified Rankin (Stroke Patients Only)       Balance Overall balance assessment: Needs assistance Sitting-balance support: Feet supported Sitting balance-Leahy Scale: Good     Standing balance support: During functional activity;Bilateral upper extremity supported Standing balance-Leahy Scale: Fair                              Cognition  Arousal/Alertness: Awake/alert Behavior During Therapy: WFL for tasks assessed/performed Overall Cognitive Status: Within Functional Limits for tasks assessed                                        Exercises      General Comments        Pertinent Vitals/Pain Pain Assessment: 0-10 Pain Score: 6  Pain Location: Rt knee Pain Descriptors / Indicators: Aching;Discomfort;Sore Pain Intervention(s): Limited activity within patient's tolerance;Monitored during session;Premedicated before session    Home Living                      Prior Function            PT Goals (current goals can now be found in the care plan section) Acute Rehab PT Goals Patient Stated Goal: to get into rehab somewhere before returning home PT Goal Formulation: With patient Time For Goal Achievement: 12/18/19 Potential to Achieve Goals: Good Progress towards PT goals: Progressing toward goals    Frequency    7X/week      PT Plan Current plan remains appropriate    Co-evaluation              AM-PAC PT "6 Clicks" Mobility   Outcome Measure  Help needed turning from your back to your side while in a flat bed without using bedrails?: A Little Help needed moving from lying  on your back to sitting on the side of a flat bed without using bedrails?: A Little Help needed moving to and from a bed to a chair (including a wheelchair)?: A Little Help needed standing up from a chair using your arms (e.g., wheelchair or bedside chair)?: A Little Help needed to walk in hospital room?: A Little Help needed climbing 3-5 steps with a railing? : A Lot 6 Click Score: 17    End of Session Equipment Utilized During Treatment: Gait belt;Right knee immobilizer Activity Tolerance: Patient tolerated treatment well Patient left: Other (comment) (sitting EOB with RN) Nurse Communication: Mobility status PT Visit Diagnosis: Muscle weakness (generalized) (M62.81);Difficulty in walking, not  elsewhere classified (R26.2)     Time: 9470-9628 PT Time Calculation (min) (ACUTE ONLY): 20 min  Charges:  $Gait Training: 8-22 mins                     [name] PT Acute Rehabilitation Services Pager [phone] Office [phone]    Rue Valladares 12/12/2019, 4:36 PM

## 2019-12-13 LAB — CBC
HCT: 34.5 % — ABNORMAL LOW (ref 39.0–52.0)
Hemoglobin: 11.4 g/dL — ABNORMAL LOW (ref 13.0–17.0)
MCH: 28.9 pg (ref 26.0–34.0)
MCHC: 33 g/dL (ref 30.0–36.0)
MCV: 87.6 fL (ref 80.0–100.0)
Platelets: 156 10*3/uL (ref 150–400)
RBC: 3.94 MIL/uL — ABNORMAL LOW (ref 4.22–5.81)
RDW: 14.1 % (ref 11.5–15.5)
WBC: 11 10*3/uL — ABNORMAL HIGH (ref 4.0–10.5)
nRBC: 0 % (ref 0.0–0.2)

## 2019-12-13 LAB — BASIC METABOLIC PANEL
Anion gap: 7 (ref 5–15)
BUN: 24 mg/dL — ABNORMAL HIGH (ref 8–23)
CO2: 22 mmol/L (ref 22–32)
Calcium: 8.7 mg/dL — ABNORMAL LOW (ref 8.9–10.3)
Chloride: 107 mmol/L (ref 98–111)
Creatinine, Ser: 1.26 mg/dL — ABNORMAL HIGH (ref 0.61–1.24)
GFR calc Af Amer: >60 mL/min (ref >60)
GFR calc non Af Amer: 54 mL/min — ABNORMAL LOW (ref >60)
Glucose, Bld: 99 mg/dL (ref 70–99)
Potassium: 4.7 mmol/L (ref 3.5–5.1)
Sodium: 136 mmol/L (ref 135–145)

## 2019-12-13 NOTE — Progress Notes (Signed)
Physical Therapy Treatment Patient Details Name: Travis Mcconnell MRN: [id number] DOB: 12-31-1940 Today's Date: 12/13/2019    History of Present Illness Patient is 79 y.o. 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    PT Comments    Pt continues motivated and progressing steadily with all tasks.  This date, pt ambulating sans KI.   Follow Up Recommendations  SNF     Equipment Recommendations  Rolling walker with 5" wheels;3in1 (PT)    Recommendations for Other Services Rehab consult     Precautions / Restrictions Precautions Precautions: Fall;Knee Restrictions Weight Bearing Restrictions: No Other Position/Activity Restrictions: WBAT    Mobility  Bed Mobility Overal bed mobility: Needs Assistance Bed Mobility: Supine to Sit     Supine to sit: Min guard     General bed mobility comments: cues for sequence and use of L LE to self assist  Transfers Overall transfer level: Needs assistance Equipment used: Rolling walker (2 wheeled) Transfers: Sit to/from Stand Sit to Stand: Min guard         General transfer comment: cues for LE management and use of UEs to self assist  Ambulation/Gait Ambulation/Gait assistance: Min guard Gait Distance (Feet): 60 Feet (twice) Assistive device: Rolling walker (2 wheeled) Gait Pattern/deviations: Step-to pattern;Decreased stride length;Decreased stance time - right;Decreased weight shift to right Gait velocity: decr   General Gait Details: cues for sequence, posture and position from [employer]             Wheelchair Mobility    Modified Rankin (Stroke Patients Only)       Balance Overall balance assessment: Needs assistance Sitting-balance support: Feet supported Sitting balance-Leahy Scale: Good     Standing balance support: During functional activity;Bilateral upper extremity supported Standing balance-Leahy Scale: Fair                               Cognition Arousal/Alertness: Awake/alert Behavior During Therapy: WFL for tasks assessed/performed Overall Cognitive Status: Within Functional Limits for tasks assessed                                        Exercises Total Joint Exercises Ankle Circles/Pumps: AROM;Both;20 reps;Seated Quad Sets: AROM;Right;10 reps;Seated Heel Slides: AAROM;Right;15 reps;Supine Straight Leg Raises: Right;Supine;20 reps;AAROM;AROM Long Arc Quad: AAROM;AROM;10 reps;Seated;Right    General Comments        Pertinent Vitals/Pain Pain Assessment: 0-10 Pain Score: 6  Pain Location: Rt knee Pain Descriptors / Indicators: Aching;Discomfort;Sore Pain Intervention(s): Limited activity within patient's tolerance;Monitored during session;Premedicated before session;Ice applied    Home Living                      Prior Function            PT Goals (current goals can now be found in the care plan section) Acute Rehab PT Goals Patient Stated Goal: to get into rehab somewhere before returning home PT Goal Formulation: With patient Time For Goal Achievement: 12/18/19 Potential to Achieve Goals: Good Progress towards PT goals: Progressing toward goals    Frequency    7X/week      PT Plan Current plan remains appropriate    Co-evaluation              AM-PAC PT "6 Clicks"  Mobility   Outcome Measure  Help needed turning from your back to your side while in a flat bed without using bedrails?: A Little Help needed moving from lying on your back to sitting on the side of a flat bed without using bedrails?: A Little Help needed moving to and from a bed to a chair (including a wheelchair)?: A Little Help needed standing up from a chair using your arms (e.g., wheelchair or bedside chair)?: A Little Help needed to walk in hospital room?: A Little Help needed climbing 3-5 steps with a railing? : A Lot 6 Click Score: 17    End of Session Equipment Utilized During  Treatment: Gait belt;Right knee immobilizer Activity Tolerance: Patient tolerated treatment well Patient left: with call bell/phone within reach;in chair;with chair alarm set Nurse Communication: Mobility status PT Visit Diagnosis: Muscle weakness (generalized) (M62.81);Difficulty in walking, not elsewhere classified (R26.2)     Time: 3244-0102 PT Time Calculation (min) (ACUTE ONLY): 29 min  Charges:  $Gait Training: 8-22 mins $Therapeutic Exercise: 8-22 mins                     [name] PT Acute Rehabilitation Services Pager [phone] Office [phone]    Bailee Thall 12/13/2019, 11:46 AM

## 2019-12-13 NOTE — Progress Notes (Signed)
Physical Therapy Treatment Patient Details Name: Travis Mcconnell MRN: [id number] DOB: [birth year] Today's Date: 12/13/2019    History of Present Illness Patient is 79 y.o. 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    PT Comments    Pt continues motivated and progressing steadily with mobility.   Follow Up Recommendations  SNF     Equipment Recommendations  Rolling walker with 5" wheels;3in1 (PT)    Recommendations for Other Services       Precautions / Restrictions Precautions Precautions: Fall;Knee Restrictions Weight Bearing Restrictions: No Other Position/Activity Restrictions: WBAT    Mobility  Bed Mobility Overal bed mobility: Needs Assistance Bed Mobility: Sit to Supine       Sit to supine: Min guard   General bed mobility comments: cues for sequence and use of L LE to self assist  Transfers Overall transfer level: Needs assistance Equipment used: Rolling walker (2 wheeled) Transfers: Sit to/from Stand Sit to Stand: Min guard;Supervision         General transfer comment: min cues for LE management and use of UEs to self assist  Ambulation/Gait Ambulation/Gait assistance: Min guard;Supervision Gait Distance (Feet): 100 Feet (twice) Assistive device: Rolling walker (2 wheeled) Gait Pattern/deviations: Step-to pattern;Decreased stride length;Decreased stance time - right;Decreased weight shift to right Gait velocity: decr   General Gait Details: cues for sequence, posture and position from [employer]             Wheelchair Mobility    Modified Rankin (Stroke Patients Only)       Balance Overall balance assessment: Needs assistance Sitting-balance support: Feet supported Sitting balance-Leahy Scale: Good     Standing balance support: During functional activity;Bilateral upper extremity supported Standing balance-Leahy Scale: Fair                              Cognition  Arousal/Alertness: Awake/alert Behavior During Therapy: WFL for tasks assessed/performed Overall Cognitive Status: Within Functional Limits for tasks assessed                                        Exercises Total Joint Exercises Ankle Circles/Pumps: AROM;Both;20 reps;Seated Quad Sets: AROM;Right;10 reps;Seated    General Comments        Pertinent Vitals/Pain Pain Assessment: 0-10 Pain Score: 6  Pain Location: Rt knee Pain Descriptors / Indicators: Aching;Discomfort;Sore Pain Intervention(s): Limited activity within patient's tolerance;Monitored during session;Premedicated before session;Ice applied    Home Living                      Prior Function            PT Goals (current goals can now be found in the care plan section) Acute Rehab PT Goals Patient Stated Goal: to get into rehab somewhere before returning home PT Goal Formulation: With patient Time For Goal Achievement: 12/18/19 Potential to Achieve Goals: Good Progress towards PT goals: Progressing toward goals    Frequency    7X/week      PT Plan Current plan remains appropriate    Co-evaluation              AM-PAC PT "6 Clicks" Mobility   Outcome Measure  Help needed turning from your back to your side while in a flat bed without  using bedrails?: A Little Help needed moving from lying on your back to sitting on the side of a flat bed without using bedrails?: A Little Help needed moving to and from a bed to a chair (including a wheelchair)?: A Little Help needed standing up from a chair using your arms (e.g., wheelchair or bedside chair)?: A Little Help needed to walk in hospital room?: A Little Help needed climbing 3-5 steps with a railing? : A Little 6 Click Score: 18    End of Session Equipment Utilized During Treatment: Gait belt Activity Tolerance: Patient tolerated treatment well Patient left: in bed;with call bell/phone within reach;with bed alarm set Nurse  Communication: Mobility status PT Visit Diagnosis: Muscle weakness (generalized) (M62.81);Difficulty in walking, not elsewhere classified (R26.2)     Time: 9233-0076 PT Time Calculation (min) (ACUTE ONLY): 26 min  Charges:  $Gait Training: 23-37 mins                     [name] PT Acute Rehabilitation Services Pager [phone] Office [phone]    Lennyn Gange 12/13/2019, 4:35 PM

## 2019-12-13 NOTE — Progress Notes (Signed)
Subjective: 2 Days Post-Op Procedure(s) (LRB): TOTAL KNEE ARTHROPLASTY (Right) Patient seen in rounds for Dr Charlann Boxer Patient reports pain as 5 on 0-10 scale.   Doing well no events overnight [facility]  Objective: Vital signs in last 24 hours: Temp:  [97.8 F (36.6 C)-98 F (36.7 C)] 98 F (36.7 C) (07/17 0555) Pulse Rate:  [77-79] 77 (07/17 0555) Resp:  [16-18] 16 (07/17 0555) BP: (134-149)/(77-93) 149/93 (07/17 0555) SpO2:  [98 %-99 %] 98 % (07/17 0555)  Intake/Output from previous day: 07/16 0701 - 07/17 0700 In: 1320 [P.O.:420; I.V.:900] Out: 1620 [Urine:1620] Intake/Output this shift: Total I/O In: -  Out: 200 [Urine:200]  Recent Labs    12/12/19 0316 12/13/19 0317  HGB 12.8* 11.4*   Recent Labs    12/12/19 0316 12/13/19 0317  WBC 10.2 11.0*  RBC 4.46 3.94*  HCT 39.3 34.5*  PLT 170 156   Recent Labs    12/12/19 0316 12/13/19 0317  NA 135 136  K 4.6 4.7  CL 104 107  CO2 23 22  BUN 16 24*  CREATININE 1.14 1.26*  GLUCOSE 109* 99  CALCIUM 8.7* 8.7*   No results for input(s): LABPT, INR in the last 72 hours.  Neurologically intact Neurovascular intact Sensation intact distally Intact pulses distally Dorsiflexion/Plantar flexion intact Incision: dressing C/D/I No cellulitis present Compartment soft   Assessment/Plan: 2 Days Post-Op Procedure(s) (LRB): TOTAL KNEE ARTHROPLASTY (Right) Up with therapy Discharge to SNF, waiting placement Continue PT while here Continue PO pain meds   Denman George [facility] [phone] 12/13/2019, 9:05 AM

## 2019-12-14 NOTE — Progress Notes (Signed)
Physical Therapy Treatment Patient Details Name: Travis Mcconnell MRN: [id number] DOB: 12/15/1940 Today's Date: 12/14/2019    History of Present Illness Patient is 79 y.o. 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    PT Comments    Pt continues cooperative but limited this date by increased pain and with noted decreased tolerance for knee ROM and decreased distance tolerated with gait.   Follow Up Recommendations  SNF     Equipment Recommendations  Rolling walker with 5" wheels;3in1 (PT)    Recommendations for Other Services Rehab consult     Precautions / Restrictions Precautions Precautions: Fall;Knee Restrictions Weight Bearing Restrictions: No Other Position/Activity Restrictions: WBAT    Mobility  Bed Mobility Overal bed mobility: Needs Assistance Bed Mobility: Supine to Sit     Supine to sit: Min guard     General bed mobility comments: cues for sequence and use of L LE to self assist  Transfers Overall transfer level: Needs assistance Equipment used: Rolling walker (2 wheeled) Transfers: Sit to/from Stand Sit to Stand: Min guard         General transfer comment: min cues for LE management and use of UEs to self assist  Ambulation/Gait Ambulation/Gait assistance: Min guard Gait Distance (Feet): 74 Feet Assistive device: Rolling walker (2 wheeled) Gait Pattern/deviations: Step-to pattern;Decreased stride length;Decreased stance time - right;Decreased weight shift to right Gait velocity: decr   General Gait Details: cues for posture and position from [employer]             Wheelchair Mobility    Modified Rankin (Stroke Patients Only)       Balance Overall balance assessment: Needs assistance Sitting-balance support: Feet supported Sitting balance-Leahy Scale: Good     Standing balance support: During functional activity;Bilateral upper extremity supported Standing balance-Leahy Scale:  Fair                              Cognition Arousal/Alertness: Awake/alert Behavior During Therapy: WFL for tasks assessed/performed Overall Cognitive Status: Within Functional Limits for tasks assessed                                        Exercises Total Joint Exercises Ankle Circles/Pumps: AROM;Both;20 reps;Seated Quad Sets: AROM;Right;10 reps;Seated Heel Slides: AAROM;Right;15 reps;Supine Straight Leg Raises: Right;Supine;20 reps;AAROM;AROM Goniometric ROM: AAROM R knee -5 - 65    General Comments        Pertinent Vitals/Pain Pain Assessment: 0-10 Pain Score: 8  Pain Location: Rt knee Pain Descriptors / Indicators: Aching;Discomfort;Sore Pain Intervention(s): Limited activity within patient's tolerance;Monitored during session;Premedicated before session;Ice applied    Home Living                      Prior Function            PT Goals (current goals can now be found in the care plan section) Acute Rehab PT Goals Patient Stated Goal: to get into rehab somewhere before returning home PT Goal Formulation: With patient Time For Goal Achievement: 12/18/19 Potential to Achieve Goals: Good Progress towards PT goals: Not progressing toward goals - comment (increased pain this date)    Frequency    7X/week      PT Plan Current plan remains appropriate    Co-evaluation  AM-PAC PT "6 Clicks" Mobility   Outcome Measure  Help needed turning from your back to your side while in a flat bed without using bedrails?: A Little Help needed moving from lying on your back to sitting on the side of a flat bed without using bedrails?: A Little Help needed moving to and from a bed to a chair (including a wheelchair)?: A Little Help needed standing up from a chair using your arms (e.g., wheelchair or bedside chair)?: A Little Help needed to walk in hospital room?: A Little Help needed climbing 3-5 steps with a railing?  : A Little 6 Click Score: 18    End of Session Equipment Utilized During Treatment: Gait belt Activity Tolerance: Patient limited by pain Patient left: in chair;with call bell/phone within reach;with chair alarm set Nurse Communication: Mobility status PT Visit Diagnosis: Muscle weakness (generalized) (M62.81);Difficulty in walking, not elsewhere classified (R26.2)     Time: 1194-1740 PT Time Calculation (min) (ACUTE ONLY): 27 min  Charges:  $Gait Training: 8-22 mins $Therapeutic Exercise: 8-22 mins                     [name] PT Acute Rehabilitation Services Pager [phone] Office [phone]    Claudine Stallings 12/14/2019, 2:15 PM

## 2019-12-14 NOTE — Progress Notes (Signed)
Subjective: 3 Days Post-Op Procedure(s) (LRB): TOTAL KNEE ARTHROPLASTY (Right)  Patient reports pain as mild to moderate.  Denies fever, chills, N/V, CP, SOB.  Tolerating POs well.  Admits to flatus.  Reports that he has worked well with therapy.  Awaiting SNF placement.  Objective:   VITALS:  Temp:  [97.5 F (36.4 C)-97.9 F (36.6 C)] 97.9 F (36.6 C) (07/18 0559) Pulse Rate:  [82-93] 82 (07/18 0559) Resp:  [16-21] 21 (07/18 0559) BP: (141-174)/(84-96) 174/96 (07/18 0559) SpO2:  [98 %-100 %] 100 % (07/18 0559)  General: WDWN patient in NAD. Psych:  Appropriate mood and affect. Neuro:  A&O x 3, Moving all extremities, sensation intact to light touch HEENT:  EOMs intact Chest:  Even non-labored respirations Skin: Aquacel dressing C/D/I, no rashes or lesions Extremities: warm/dry, mild edema to R knee, no erythema or echymosis.  No lymphadenopathy. Pulses: Popliteus 2+ MSK:  ROM: lacks 5 degrees TKE, MMT: able to perform quad set, (-) Homan's    LABS Recent Labs    12/12/19 0316 12/13/19 0317  HGB 12.8* 11.4*  WBC 10.2 11.0*  PLT 170 156   Recent Labs    12/12/19 0316 12/13/19 0317  NA 135 136  K 4.6 4.7  CL 104 107  CO2 23 22  BUN 16 24*  CREATININE 1.14 1.26*  GLUCOSE 109* 99   No results for input(s): LABPT, INR in the last 72 hours.   Assessment/Plan: 3 Days Post-Op Procedure(s) (LRB): TOTAL KNEE ARTHROPLASTY (Right)  Patient seen in rounds for Dr. Margretta Sidle LE Up with therapy Disp: SNF, awaiting placement Plan for outpatient post-op visit with Dr. Genevie Cheshire PA-C [facility] Office:  [phone]

## 2019-12-14 NOTE — Plan of Care (Signed)
  Problem: Clinical Measurements: Goal: Respiratory complications will improve Outcome: Progressing   Problem: Clinical Measurements: Goal: Cardiovascular complication will be avoided Outcome: Progressing   Problem: Elimination: Goal: Will not experience complications related to bowel motility Outcome: Progressing   Problem: Activity: Goal: Ability to avoid complications of mobility impairment will improve Outcome: Progressing   Problem: Pain Management: Goal: Pain level will decrease with appropriate interventions Outcome: Progressing   Problem: Skin Integrity: Goal: Will show signs of wound healing Outcome: Progressing

## 2019-12-14 NOTE — TOC Progression Note (Signed)
Transition of Care [facility]) - Progression Note    Patient Details  Name: Travis Mcconnell MRN: [id number] Date of Birth: [birth year]  [facility] CM/SW Contact  Marina Goodell Phone Number: [phone] 12/14/2019, 1:57 PM  Clinical Narrative:      Placement pending auth from [facility] for SNF.  Expected Discharge Plan: Skilled Nursing Facility Barriers to Discharge: Continued Medical Work up  Expected Discharge Plan and Services Expected Discharge Plan: Skilled Nursing Facility In-house Referral: Clinical Social Work   Post Acute Care Choice: Skilled Nursing Facility Living arrangements for the past 2 months: Single Family Home                                       Social Determinants of Health (SDOH) Interventions    Readmission Risk Interventions No flowsheet data found.

## 2019-12-15 NOTE — TOC Progression Note (Signed)
Transition of Care [facility]) - Progression Note    Patient Details  Name: Travis Mcconnell MRN: [id number] Date of Birth: [birth year]  [facility]) CM/SW Contact  Clearance Coots, LCSW Phone Number: 12/15/2019, 9:24 AM  Clinical Narrative:    Requested documentation faxed to the [facility] review. [facility]. CSW will notify the patient and staff when a decision has been made by the [place].    Expected Discharge Plan: Skilled Nursing Facility Barriers to Discharge: Insurance Authorization  Expected Discharge Plan and Services Expected Discharge Plan: Skilled Nursing Facility In-house Referral: Clinical Social Work   Post Acute Care Choice: Skilled Nursing Facility Living arrangements for the past 2 months: Single Family Home                                       Social Determinants of Health (SDOH) Interventions    Readmission Risk Interventions No flowsheet data found.

## 2019-12-15 NOTE — NC FL2 (Signed)
[place] MEDICAID FL2 LEVEL OF CARE SCREENING TOOL     IDENTIFICATION  Patient Name: Travis Mcconnell Birthdate: 1940/09/17 Sex: male Admission Date (Current Location): 12/11/2019  [facility] and [place] Number:  Producer, television/film/video and Address:  [facility],  [address] [place], [place] [address]      Provider Number: [id number]  Attending Physician Name and Address:  Durene Romans, MD  Relative Name and Phone Number:  Keegen, Heffern [phone]    Current Level of Care: Hospital Recommended Level of Care: Skilled Nursing Facility Prior Approval Number:    Date Approved/Denied:   PASRR Number:   [id number]  Discharge Plan: SNF    Current Diagnoses: Patient Active Problem List   Diagnosis Date Noted  . Osteoarthritis of right knee 12/11/2019  . 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/11/2019    Orientation RESPIRATION BLADDER Height & Weight     Self, Time, Situation, Place  Normal Continent Weight: 195 lb (88.5 kg) Height:  5' 10.5" (179.1 cm)  BEHAVIORAL SYMPTOMS/MOOD NEUROLOGICAL BOWEL NUTRITION STATUS      Continent Diet  AMBULATORY STATUS COMMUNICATION OF NEEDS Skin   Extensive Assist Verbally Surgical wounds                       Personal Care Assistance Level of Assistance  Bathing, Feeding, Dressing Bathing Assistance: Limited assistance Feeding assistance: Independent Dressing Assistance: Limited assistance     Functional Limitations Info  Sight, Hearing, Speech Sight Info: Adequate Hearing Info: Adequate Speech Info: Adequate    SPECIAL CARE FACTORS FREQUENCY  PT (By licensed PT), OT (By licensed OT)     PT Frequency: 5x/week OT Frequency: 5x/week            Contractures Contractures Info: Not present    Additional Factors Info  Code Status, Allergies, Psychotropic Code Status Info: Fullcode Allergies Info: Allergies: Other, Tramadol           Current Medications (12/15/2019):  This is the current hospital  active medication list Current Facility-Administered Medications  Medication Dose Route Frequency Provider Last Rate Last Admin  . 0.9 %  sodium chloride infusion   Intravenous Continuous Lanney Gins, PA-C   Stopped at 12/13/19 1405  . acetaminophen (TYLENOL) tablet 325-650 mg  325-650 mg Oral Q6H PRN Lanney Gins, PA-C      . alum & mag hydroxide-simeth (MAALOX/MYLANTA) 200-200-20 MG/5ML suspension 15 mL  15 mL Oral Q4H PRN Lanney Gins, PA-C      . aspirin chewable tablet 81 mg  81 mg Oral BID Lanney Gins, PA-C   81 mg at 12/15/19 0759  . bisacodyl (DULCOLAX) suppository 10 mg  10 mg Rectal Daily PRN Lanney Gins, PA-C      . celecoxib (CELEBREX) capsule 200 mg  200 mg Oral BID Lanney Gins, PA-C   200 mg at 12/15/19 0759  . diphenhydrAMINE (BENADRYL) 12.5 MG/5ML elixir 12.5-25 mg  12.5-25 mg Oral Q4H PRN Lanney Gins, PA-C      . docusate sodium (COLACE) capsule 100 mg  100 mg Oral BID Babish, Matthew, PA-C   100 mg at 12/15/19 0800  . fentaNYL (SUBLIMAZE) injection 50-100 mcg  50-100 mcg Intravenous Maryclare Labrador, MD   50 mcg at 12/11/19 1105  . ferrous sulfate tablet 325 mg  325 mg Oral BID WC Lanney Gins, PA-C   325 mg at 12/15/19 0800  . HYDROcodone-acetaminophen (NORCO) 7.5-325 MG per tablet 1-2 tablet  1-2 tablet Oral Q4H PRN  Lanney Gins, PA-C   1 tablet at 12/14/19 1412  . HYDROcodone-acetaminophen (NORCO/VICODIN) 5-325 MG per tablet 1-2 tablet  1-2 tablet Oral Q4H PRN Lanney Gins, PA-C   2 tablet at 12/15/19 1231  . HYDROmorphone (DILAUDID) injection 0.5-1 mg  0.5-1 mg Intravenous Q2H PRN Babish, Matthew, PA-C      . magnesium citrate solution 1 Bottle  1 Bottle Oral Once PRN Babish, Matthew, PA-C      . menthol-cetylpyridinium (CEPACOL) lozenge 3 mg  1 lozenge Oral PRN Lanney Gins, PA-C       Or  . phenol (CHLORASEPTIC) mouth spray 1 spray  1 spray Mouth/Throat PRN Lanney Gins, PA-C      . methocarbamol (ROBAXIN) tablet 500 mg  500 mg  Oral Q6H PRN Lanney Gins, PA-C   500 mg at 12/15/19 0759   Or  . methocarbamol (ROBAXIN) 500 mg in dextrose 5 % 50 mL IVPB  500 mg Intravenous Q6H PRN Babish, Matthew, PA-C      . metoCLOPramide (REGLAN) tablet 5-10 mg  5-10 mg Oral Q8H PRN Lanney Gins, PA-C       Or  . metoCLOPramide (REGLAN) injection 5-10 mg  5-10 mg Intravenous Q8H PRN Babish, Matthew, PA-C      . ondansetron (ZOFRAN) tablet 4 mg  4 mg Oral Q6H PRN Lanney Gins, PA-C       Or  . ondansetron (ZOFRAN) injection 4 mg  4 mg Intravenous Q6H PRN Babish, Matthew, PA-C      . polyethylene glycol (MIRALAX / GLYCOLAX) packet 17 g  17 g Oral BID Babish, Matthew, PA-C   17 g at 12/15/19 0800     Discharge Medications: Please see discharge summary for a list of discharge medications.  Relevant Imaging Results:  Relevant Lab Results:   Additional Information ssn: [id number]  Clearance Coots, LCSW

## 2019-12-15 NOTE — TOC Progression Note (Signed)
Transition of Care [facility]) - Progression Note    Patient Details  Name: Travis Mcconnell MRN: [id number] Date of Birth: 08-26-1940  [facility] CM/SW Contact  Clearance Coots, LCSW Phone Number: 12/15/2019, 5:58 PM  Clinical Narrative:    Patient has been approved by [employer] (32 days) to transfer to a in network [place] SNF. CSW provided the patient with a list of SNF's. Patient chose area SNF [facility]. CSW reached out the facility.Patient clinical information is being reviewed by [facility] staff. CSW notified patient. Patient will need updated covid test.    Expected Discharge Plan: Skilled Nursing Facility Barriers to Discharge: No SNF bed, Other (comment) (Covid test)  Expected Discharge Plan and Services Expected Discharge Plan: Skilled Nursing Facility In-house Referral: Clinical Social Work   Post Acute Care Choice: Skilled Nursing Facility Living arrangements for the past 2 months: Single Family Home                                       Social Determinants of Health (SDOH) Interventions    Readmission Risk Interventions No flowsheet data found.
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
## 2019-12-15 NOTE — Progress Notes (Signed)
Physical Therapy Treatment Patient Details Name: Travis Mcconnell MRN: [id number] DOB: 20-Jan-1941 Today's Date: 12/15/2019    History of Present Illness Patient is 79 y.o. 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    PT Comments    Pt demonstrating gradual progress.  Does continue to required min A at times and cues for safe transfers.  He required cues for exercise technique and AAROM at times.  Pt demonstrating fair quad activation but ROM limited by pain.  Pt does live alone so would benefit from SNF as he is not safe to return home alone.  Cont POC.     Follow Up Recommendations  SNF     Equipment Recommendations  Rolling walker with 5" wheels;3in1 (PT)    Recommendations for Other Services       Precautions / Restrictions Precautions Precautions: Fall Restrictions Other Position/Activity Restrictions: WBAT    Mobility  Bed Mobility Overal bed mobility: Needs Assistance Bed Mobility: Supine to Sit     Supine to sit: Min guard     General bed mobility comments: cues for sequence and use of gait belt to self assist R LE  Transfers Overall transfer level: Needs assistance Equipment used: Rolling walker (2 wheeled) Transfers: Sit to/from Stand Sit to Stand: Min guard         General transfer comment: min cues for LE management and use of UEs to self assist  Ambulation/Gait Ambulation/Gait assistance: Min guard Gait Distance (Feet): 75 Feet Assistive device: Rolling walker (2 wheeled) Gait Pattern/deviations: Step-to pattern;Decreased stride length;Decreased stance time - right;Decreased weight shift to right;Trunk flexed Gait velocity: decr   General Gait Details: cues for posture and position from [employer]             Wheelchair Mobility    Modified Rankin (Stroke Patients Only)       Balance Overall balance assessment: Needs assistance Sitting-balance support: Feet supported Sitting  balance-Leahy Scale: Good     Standing balance support: During functional activity;Bilateral upper extremity supported Standing balance-Leahy Scale: Fair                              Cognition Arousal/Alertness: Awake/alert Behavior During Therapy: WFL for tasks assessed/performed Overall Cognitive Status: Within Functional Limits for tasks assessed                                        Exercises Total Joint Exercises Ankle Circles/Pumps: AROM;Both;10 reps;Supine Quad Sets: AROM;Both;10 reps;Supine Towel Squeeze: AROM;Both;10 reps;Supine Heel Slides: AAROM;Right;10 reps;Supine Hip ABduction/ADduction: AAROM;Right;10 reps;Supine Long Arc Quad: AROM;Right;10 reps;Seated Knee Flexion: AAROM;Right;10 reps;Seated Goniometric ROM: AAROM R knee lacks 5 ext; 50 flexion; limited by pain/edema    General Comments General comments (skin integrity, edema, etc.): Pt educated on safe ice use, no pivots, and resting with leg straight.  Also educated on HEP and provided handout      Pertinent Vitals/Pain Pain Assessment: 0-10 Pain Score: 4  Pain Location: Rt knee Pain Descriptors / Indicators: Aching;Discomfort;Sore Pain Intervention(s): Limited activity within patient's tolerance;Monitored during session;Premedicated before session;Repositioned;Ice applied    Home Living                      Prior Function  PT Goals (current goals can now be found in the care plan section) Acute Rehab PT Goals Patient Stated Goal: to get into rehab somewhere before returning home PT Goal Formulation: With patient Time For Goal Achievement: 12/18/19 Potential to Achieve Goals: Good Progress towards PT goals: Progressing toward goals    Frequency    7X/week      PT Plan Current plan remains appropriate    Co-evaluation              AM-PAC PT "6 Clicks" Mobility   Outcome Measure  Help needed turning from your back to your side while  in a flat bed without using bedrails?: A Little Help needed moving from lying on your back to sitting on the side of a flat bed without using bedrails?: A Little Help needed moving to and from a bed to a chair (including a wheelchair)?: A Little Help needed standing up from a chair using your arms (e.g., wheelchair or bedside chair)?: None Help needed to walk in hospital room?: None Help needed climbing 3-5 steps with a railing? : A Little 6 Click Score: 20    End of Session Equipment Utilized During Treatment: Gait belt Activity Tolerance: Patient limited by pain Patient left: in chair;with call bell/phone within reach;with chair alarm set Nurse Communication: Mobility status PT Visit Diagnosis: Muscle weakness (generalized) (M62.81);Difficulty in walking, not elsewhere classified (R26.2)     Time: 7903-8333 PT Time Calculation (min) (ACUTE ONLY): 27 min  Charges:  $Gait Training: 8-22 mins $Therapeutic Exercise: 8-22 mins                     Anise Salvo, PT Acute Rehab Services Pager [phone] [facility] [phone]     Rayetta Humphrey 12/15/2019, 1:27 PM

## 2019-12-16 LAB — SARS CORONAVIRUS 2 BY RT PCR (HOSPITAL ORDER, PERFORMED IN ~~LOC~~ HOSPITAL LAB): SARS Coronavirus 2: NEGATIVE
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
## 2019-12-16 NOTE — Plan of Care (Signed)
Report given to Water engineer at [place].
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
## 2019-12-16 NOTE — TOC Transition Note (Addendum)
Transition of Care [facility]) - CM/SW Discharge Note   Patient Details  Name: Travis Mcconnell MRN: [id number] Date of Birth: January 26, 1941  [facility]) CM/SW Contact:  Clearance Coots, LCSW Phone Number: 12/16/2019, 9:42 AM   Clinical Narrative:    Patient accepted and approved to transition to [facility] today, pending a negative covid test.  Nurse notified PA to order a rapid test.  Rapid Test resulted Negative.   D/C Summary pending.  Nurse call report to: [phone] Room: 7011   Final next level of care: Skilled Nursing Facility Barriers to Discharge: Barriers Resolved.    Patient Goals and CMS Choice Patient states their goals for this hospitalization and ongoing recovery are:: to go to rehab then home CMS Medicare.gov Compare Post Acute Care list provided to:: Patient Choice offered to / list presented to : Patient  Discharge Placement PASRR number recieved: 12/16/19            Patient [facility] at[facility] Patient to be transferred to facility by: [facility] Name of family member notified: Patient to notify his spouse Patient and family notified of of transfer: 12/16/19  Discharge Plan and Services In-house Referral: Clinical Social Work   Post Acute Care Choice: Skilled Nursing Facility          DME Arranged: N/A DME Agency: NA       HH Arranged: NA HH Agency: NA        Social Determinants of Health (SDOH) Interventions     Readmission Risk Interventions No flowsheet data found.

## 2020-09-10 ENCOUNTER — Emergency Department (HOSPITAL_COMMUNITY): Payer: No Typology Code available for payment source

## 2020-09-10 ENCOUNTER — Inpatient Hospital Stay (HOSPITAL_COMMUNITY)
Admission: EM | Admit: 2020-09-10 | Discharge: 2020-09-17 | DRG: 041 | Disposition: A | Discharge status: Skilled Nursing Facility | Payer: No Typology Code available for payment source | Attending: Family Medicine | Admitting: Family Medicine

## 2020-09-10 DIAGNOSIS — Z885 Allergy status to narcotic agent status: Secondary | ICD-10-CM

## 2020-09-10 DIAGNOSIS — I472 Ventricular tachycardia: Secondary | ICD-10-CM | POA: Diagnosis present

## 2020-09-10 DIAGNOSIS — R072 Precordial pain: Secondary | ICD-10-CM | POA: Diagnosis present

## 2020-09-10 DIAGNOSIS — J438 Other emphysema: Secondary | ICD-10-CM | POA: Diagnosis present

## 2020-09-10 DIAGNOSIS — R471 Dysarthria and anarthria: Secondary | ICD-10-CM | POA: Diagnosis present

## 2020-09-10 DIAGNOSIS — Z888 Allergy status to other drugs, medicaments and biological substances status: Secondary | ICD-10-CM

## 2020-09-10 DIAGNOSIS — G8191 Hemiplegia, unspecified affecting right dominant side: Secondary | ICD-10-CM | POA: Diagnosis present

## 2020-09-10 DIAGNOSIS — I63412 Cerebral infarction due to embolism of left middle cerebral artery: Secondary | ICD-10-CM | POA: Diagnosis not present

## 2020-09-10 DIAGNOSIS — I10 Essential (primary) hypertension: Secondary | ICD-10-CM

## 2020-09-10 DIAGNOSIS — F17210 Nicotine dependence, cigarettes, uncomplicated: Secondary | ICD-10-CM | POA: Diagnosis present

## 2020-09-10 DIAGNOSIS — I639 Cerebral infarction, unspecified: Secondary | ICD-10-CM | POA: Diagnosis present

## 2020-09-10 DIAGNOSIS — R29714 NIHSS score 14: Secondary | ICD-10-CM | POA: Diagnosis present

## 2020-09-10 DIAGNOSIS — E785 Hyperlipidemia, unspecified: Secondary | ICD-10-CM | POA: Diagnosis present

## 2020-09-10 DIAGNOSIS — I4729 Other ventricular tachycardia: Secondary | ICD-10-CM

## 2020-09-10 DIAGNOSIS — E663 Overweight: Secondary | ICD-10-CM | POA: Diagnosis present

## 2020-09-10 DIAGNOSIS — Z23 Encounter for immunization: Secondary | ICD-10-CM

## 2020-09-10 DIAGNOSIS — K5909 Other constipation: Secondary | ICD-10-CM | POA: Diagnosis present

## 2020-09-10 DIAGNOSIS — R130 Aphagia: Secondary | ICD-10-CM | POA: Diagnosis present

## 2020-09-10 DIAGNOSIS — I493 Ventricular premature depolarization: Secondary | ICD-10-CM

## 2020-09-10 DIAGNOSIS — R29810 Facial weakness: Secondary | ICD-10-CM | POA: Diagnosis present

## 2020-09-10 DIAGNOSIS — I63411 Cerebral infarction due to embolism of right middle cerebral artery: Secondary | ICD-10-CM | POA: Diagnosis not present

## 2020-09-10 DIAGNOSIS — Z87891 Personal history of nicotine dependence: Secondary | ICD-10-CM

## 2020-09-10 DIAGNOSIS — Z79899 Other long term (current) drug therapy: Secondary | ICD-10-CM

## 2020-09-10 DIAGNOSIS — R4701 Aphasia: Secondary | ICD-10-CM | POA: Diagnosis present

## 2020-09-10 DIAGNOSIS — I4510 Unspecified right bundle-branch block: Secondary | ICD-10-CM | POA: Diagnosis present

## 2020-09-10 DIAGNOSIS — Y900 Blood alcohol level of less than 20 mg/100 ml: Secondary | ICD-10-CM | POA: Diagnosis present

## 2020-09-10 DIAGNOSIS — Z7289 Other problems related to lifestyle: Secondary | ICD-10-CM

## 2020-09-10 DIAGNOSIS — Z96651 Presence of right artificial knee joint: Secondary | ICD-10-CM | POA: Diagnosis present

## 2020-09-10 DIAGNOSIS — Z20822 Contact with and (suspected) exposure to covid-19: Secondary | ICD-10-CM | POA: Diagnosis present

## 2020-09-10 DIAGNOSIS — Z6826 Body mass index (BMI) 26.0-26.9, adult: Secondary | ICD-10-CM

## 2020-09-10 LAB — DIFFERENTIAL
Abs Immature Granulocytes: 0.01 10*3/uL (ref 0.00–0.07)
Basophils Absolute: 0 10*3/uL (ref 0.0–0.1)
Basophils Relative: 1 %
Eosinophils Absolute: 0.1 10*3/uL (ref 0.0–0.5)
Eosinophils Relative: 1 %
Immature Granulocytes: 0 %
Lymphocytes Relative: 33 %
Lymphs Abs: 1.6 10*3/uL (ref 0.7–4.0)
Monocytes Absolute: 0.7 10*3/uL (ref 0.1–1.0)
Monocytes Relative: 14 %
Neutro Abs: 2.6 10*3/uL (ref 1.7–7.7)
Neutrophils Relative %: 51 %

## 2020-09-10 LAB — URINALYSIS, ROUTINE W REFLEX MICROSCOPIC
Bilirubin Urine: NEGATIVE
Glucose, UA: NEGATIVE mg/dL
Hgb urine dipstick: NEGATIVE
Ketones, ur: NEGATIVE mg/dL
Leukocytes,Ua: NEGATIVE
Nitrite: NEGATIVE
Protein, ur: NEGATIVE mg/dL
Specific Gravity, Urine: 1.036 — ABNORMAL HIGH (ref 1.005–1.030)
pH: 6 (ref 5.0–8.0)

## 2020-09-10 LAB — COMPREHENSIVE METABOLIC PANEL
ALT: 13 U/L (ref 0–44)
AST: 21 U/L (ref 15–41)
Albumin: 3.8 g/dL (ref 3.5–5.0)
Alkaline Phosphatase: 90 U/L (ref 38–126)
Anion gap: 8 (ref 5–15)
BUN: 14 mg/dL (ref 8–23)
CO2: 23 mmol/L (ref 22–32)
Calcium: 9.1 mg/dL (ref 8.9–10.3)
Chloride: 110 mmol/L (ref 98–111)
Creatinine, Ser: 1.45 mg/dL — ABNORMAL HIGH (ref 0.61–1.24)
GFR, Estimated: 49 mL/min — ABNORMAL LOW (ref >60)
Glucose, Bld: 93 mg/dL (ref 70–99)
Potassium: 4.1 mmol/L (ref 3.5–5.1)
Sodium: 141 mmol/L (ref 135–145)
Total Bilirubin: 1.1 mg/dL (ref 0.3–1.2)
Total Protein: 7.3 g/dL (ref 6.5–8.1)

## 2020-09-10 LAB — I-STAT CHEM 8, ED
BUN: 19 mg/dL (ref 8–23)
Calcium, Ion: 1.09 mmol/L — ABNORMAL LOW (ref 1.15–1.40)
Chloride: 110 mmol/L (ref 98–111)
Creatinine, Ser: 1.4 mg/dL — ABNORMAL HIGH (ref 0.61–1.24)
Glucose, Bld: 90 mg/dL (ref 70–99)
HCT: 46 % (ref 39.0–52.0)
Hemoglobin: 15.6 g/dL (ref 13.0–17.0)
Potassium: 4.5 mmol/L (ref 3.5–5.1)
Sodium: 143 mmol/L (ref 135–145)
TCO2: 25 mmol/L (ref 22–32)

## 2020-09-10 LAB — RAPID URINE DRUG SCREEN, HOSP PERFORMED
Amphetamines: NOT DETECTED
Barbiturates: NOT DETECTED
Benzodiazepines: NOT DETECTED
Cocaine: NOT DETECTED
Opiates: NOT DETECTED
Tetrahydrocannabinol: NOT DETECTED

## 2020-09-10 LAB — CBC
HCT: 46.1 % (ref 39.0–52.0)
Hemoglobin: 15 g/dL (ref 13.0–17.0)
MCH: 28.3 pg (ref 26.0–34.0)
MCHC: 32.5 g/dL (ref 30.0–36.0)
MCV: 87 fL (ref 80.0–100.0)
Platelets: 189 10*3/uL (ref 150–400)
RBC: 5.3 MIL/uL (ref 4.22–5.81)
RDW: 14.3 % (ref 11.5–15.5)
WBC: 5 10*3/uL (ref 4.0–10.5)
nRBC: 0 % (ref 0.0–0.2)

## 2020-09-10 LAB — CBG MONITORING, ED: Glucose-Capillary: 81 mg/dL (ref 70–99)

## 2020-09-10 LAB — ETHANOL: Alcohol, Ethyl (B): <10 mg/dL (ref <10)

## 2020-09-10 LAB — PROTIME-INR
INR: 1.1 (ref 0.8–1.2)
Prothrombin Time: 14.1 seconds (ref 11.4–15.2)

## 2020-09-10 LAB — APTT: aPTT: 30 seconds (ref 24–36)

## 2020-09-10 LAB — RESP PANEL BY RT-PCR (FLU A&B, COVID) ARPGX2
Influenza A by PCR: NEGATIVE
Influenza B by PCR: NEGATIVE
SARS Coronavirus 2 by RT PCR: NEGATIVE

## 2020-09-10 MED ORDER — ASPIRIN 300 MG RE SUPP
300.0000 mg | Freq: Every day | RECTAL | Status: DC
  Administered 2020-09-11: 300 mg via RECTAL
  Filled 2020-09-10: qty 1

## 2020-09-10 MED ORDER — ENOXAPARIN SODIUM 30 MG/0.3ML ~~LOC~~ SOLN
30.0000 mg | Freq: Every day | SUBCUTANEOUS | Status: DC
  Administered 2020-09-10 – 2020-09-11 (×2): 30 mg via SUBCUTANEOUS
  Filled 2020-09-10 (×2): qty 0.3

## 2020-09-10 MED ORDER — IOHEXOL 350 MG/ML SOLN
100.0000 mL | Freq: Once | INTRAVENOUS | Status: AC | PRN
  Administered 2020-09-10: 100 mL via INTRAVENOUS

## 2020-09-10 MED ORDER — ASPIRIN 325 MG PO TABS
325.0000 mg | ORAL_TABLET | Freq: Every day | ORAL | Status: DC
  Administered 2020-09-11: 325 mg via ORAL
  Filled 2020-09-10: qty 1

## 2020-09-10 MED ORDER — SODIUM CHLORIDE 0.9 % IV SOLN: INTRAVENOUS | Status: DC

## 2020-09-10 MED ORDER — POLYETHYLENE GLYCOL 3350 17 G PO PACK
17.0000 g | PACK | Freq: Two times a day (BID) | ORAL | Status: DC
  Administered 2020-09-11 – 2020-09-16 (×6): 17 g via ORAL
  Filled 2020-09-10 (×10): qty 1

## 2020-09-10 MED ORDER — DOCUSATE SODIUM 100 MG PO CAPS
100.0000 mg | ORAL_CAPSULE | Freq: Two times a day (BID) | ORAL | Status: DC
  Administered 2020-09-11 – 2020-09-17 (×10): 100 mg via ORAL
  Filled 2020-09-10 (×12): qty 1

## 2020-09-10 MED ORDER — HEPARIN SODIUM (PORCINE) 5000 UNIT/ML IJ SOLN: 5000.0000 [IU] | Freq: Three times a day (TID) | INTRAMUSCULAR | Status: DC

## 2020-09-10 NOTE — ED Notes (Signed)
Pt seen sitting self up in bed and calling out for assistance to urinate. Urinal used in bed with the assistance of this RN and Lauren, NT.
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
## 2020-09-10 NOTE — ED Notes (Signed)
Activated Code Stroke 

## 2020-09-10 NOTE — ED Notes (Signed)
Patient transported to MRI 
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
## 2020-09-10 NOTE — Progress Notes (Addendum)
Attempted to call wife, Andriy Sherk, at the number listed in chart. [phone] . Rang for a couple minutes until I received message that call could not be completed as dialed.   Also called home number in file. Turns out this is the patient's cell phone. [phone]. Will update file that this is a mobile, not home, phone.   A cursory internet search for Mr. Hyle contact information listed the above numbers and correct address along with the number [phone]. I called this number; it went to voicemail without indicating who owned this number. No VM left.   A brief internet search for the wife's contact information listed the number in the chart ([phone]) but also her address as [address], [place], [place] [address], [facility] ([address]). Additional phone numbers include [phone] (could not be completed as dialed), [phone] (could not be completed as dialed), and [phone] (not in service). Called [address] at [phone] to get an updated phone number for Ms. Kolsen Choe; no after-hours line available, did not leave VM.   Searched both Mr. Whitenack name and address in the county assessor's website, no contact information listed.   Fayette Pho, MD

## 2020-09-10 NOTE — ED Provider Notes (Signed)
Patient placed in Quick Look pathway, seen and evaluated   Chief Complaint: Pt complains of difficulty talking   HPI:   Pt has drop of right arm.  Pt having difficulty speaking.    ROS: unable   Pt denies any histroy of cva  Physical Exam:   Gen: No distress  Neuro: Awake and Alert  Skin: Warm    Focused Exam: WDWN, seems sleepy, swelling right hand  Right arm unable to lift  Lungs clear Heart RRR   Initiation of care has begun. The patient has been counseled on the process, plan, and necessity for staying for the completion/evaluation, and the remainder of the medical screening examination   Osie Cheeks 09/10/20 1843    Sabino Donovan, MD 09/10/20 [phone]

## 2020-09-10 NOTE — ED Triage Notes (Signed)
Pt here POV with R arm flaccid and expressive aphasia. Pt reports LKW 1300 today. Code stroke activated in triage.

## 2020-09-10 NOTE — ED Notes (Signed)
CBG 81.  
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
## 2020-09-10 NOTE — ED Notes (Signed)
EKG printed showing beats of continuous PVCs & given to Dr. Wilkie Aye

## 2020-09-10 NOTE — ED Notes (Signed)
RN attempted to call report x1 

## 2020-09-11 ENCOUNTER — Encounter (HOSPITAL_COMMUNITY): Payer: Self-pay | Admitting: Family Medicine

## 2020-09-11 ENCOUNTER — Observation Stay (HOSPITAL_COMMUNITY): Payer: No Typology Code available for payment source

## 2020-09-11 ENCOUNTER — Other Ambulatory Visit: Payer: Self-pay

## 2020-09-11 ENCOUNTER — Other Ambulatory Visit (HOSPITAL_COMMUNITY): Payer: No Typology Code available for payment source

## 2020-09-11 DIAGNOSIS — I63412 Cerebral infarction due to embolism of left middle cerebral artery: Secondary | ICD-10-CM | POA: Diagnosis present

## 2020-09-11 DIAGNOSIS — J438 Other emphysema: Secondary | ICD-10-CM | POA: Diagnosis present

## 2020-09-11 DIAGNOSIS — Z7289 Other problems related to lifestyle: Secondary | ICD-10-CM | POA: Diagnosis not present

## 2020-09-11 DIAGNOSIS — Z23 Encounter for immunization: Secondary | ICD-10-CM | POA: Diagnosis not present

## 2020-09-11 DIAGNOSIS — R072 Precordial pain: Secondary | ICD-10-CM | POA: Diagnosis present

## 2020-09-11 DIAGNOSIS — R29714 NIHSS score 14: Secondary | ICD-10-CM | POA: Diagnosis present

## 2020-09-11 DIAGNOSIS — Z20822 Contact with and (suspected) exposure to covid-19: Secondary | ICD-10-CM | POA: Diagnosis present

## 2020-09-11 DIAGNOSIS — I639 Cerebral infarction, unspecified: Secondary | ICD-10-CM

## 2020-09-11 DIAGNOSIS — R4701 Aphasia: Secondary | ICD-10-CM | POA: Diagnosis present

## 2020-09-11 DIAGNOSIS — I6389 Other cerebral infarction: Secondary | ICD-10-CM | POA: Diagnosis not present

## 2020-09-11 DIAGNOSIS — I4510 Unspecified right bundle-branch block: Secondary | ICD-10-CM | POA: Diagnosis present

## 2020-09-11 DIAGNOSIS — N1831 Chronic kidney disease, stage 3a: Secondary | ICD-10-CM

## 2020-09-11 DIAGNOSIS — Z888 Allergy status to other drugs, medicaments and biological substances status: Secondary | ICD-10-CM | POA: Diagnosis not present

## 2020-09-11 DIAGNOSIS — Z87891 Personal history of nicotine dependence: Secondary | ICD-10-CM | POA: Diagnosis not present

## 2020-09-11 DIAGNOSIS — G8191 Hemiplegia, unspecified affecting right dominant side: Secondary | ICD-10-CM | POA: Diagnosis present

## 2020-09-11 DIAGNOSIS — I10 Essential (primary) hypertension: Secondary | ICD-10-CM | POA: Diagnosis present

## 2020-09-11 DIAGNOSIS — K5909 Other constipation: Secondary | ICD-10-CM | POA: Diagnosis present

## 2020-09-11 DIAGNOSIS — I493 Ventricular premature depolarization: Secondary | ICD-10-CM | POA: Diagnosis not present

## 2020-09-11 DIAGNOSIS — E785 Hyperlipidemia, unspecified: Secondary | ICD-10-CM | POA: Diagnosis present

## 2020-09-11 DIAGNOSIS — I472 Ventricular tachycardia: Secondary | ICD-10-CM | POA: Diagnosis present

## 2020-09-11 DIAGNOSIS — Z885 Allergy status to narcotic agent status: Secondary | ICD-10-CM | POA: Diagnosis not present

## 2020-09-11 DIAGNOSIS — E7800 Pure hypercholesterolemia, unspecified: Secondary | ICD-10-CM

## 2020-09-11 DIAGNOSIS — R471 Dysarthria and anarthria: Secondary | ICD-10-CM | POA: Diagnosis present

## 2020-09-11 DIAGNOSIS — Z79899 Other long term (current) drug therapy: Secondary | ICD-10-CM | POA: Diagnosis not present

## 2020-09-11 DIAGNOSIS — R130 Aphagia: Secondary | ICD-10-CM | POA: Diagnosis present

## 2020-09-11 DIAGNOSIS — F17210 Nicotine dependence, cigarettes, uncomplicated: Secondary | ICD-10-CM | POA: Diagnosis present

## 2020-09-11 DIAGNOSIS — Y900 Blood alcohol level of less than 20 mg/100 ml: Secondary | ICD-10-CM | POA: Diagnosis present

## 2020-09-11 DIAGNOSIS — Z96651 Presence of right artificial knee joint: Secondary | ICD-10-CM | POA: Diagnosis present

## 2020-09-11 DIAGNOSIS — R29810 Facial weakness: Secondary | ICD-10-CM | POA: Diagnosis present

## 2020-09-11 LAB — LIPID PANEL
Cholesterol: 173 mg/dL (ref 0–200)
HDL: 59 mg/dL (ref >40)
LDL Cholesterol: 107 mg/dL — ABNORMAL HIGH (ref 0–99)
Total CHOL/HDL Ratio: 2.9 RATIO
Triglycerides: 36 mg/dL (ref <150)
VLDL: 7 mg/dL (ref 0–40)

## 2020-09-11 LAB — CBC
HCT: 44.6 % (ref 39.0–52.0)
Hemoglobin: 14.5 g/dL (ref 13.0–17.0)
MCH: 28.5 pg (ref 26.0–34.0)
MCHC: 32.5 g/dL (ref 30.0–36.0)
MCV: 87.8 fL (ref 80.0–100.0)
Platelets: 181 10*3/uL (ref 150–400)
RBC: 5.08 MIL/uL (ref 4.22–5.81)
RDW: 14.3 % (ref 11.5–15.5)
WBC: 4.5 10*3/uL (ref 4.0–10.5)
nRBC: 0 % (ref 0.0–0.2)

## 2020-09-11 LAB — COMPREHENSIVE METABOLIC PANEL
ALT: 13 U/L (ref 0–44)
AST: 16 U/L (ref 15–41)
Albumin: 3.4 g/dL — ABNORMAL LOW (ref 3.5–5.0)
Alkaline Phosphatase: 79 U/L (ref 38–126)
Anion gap: 5 (ref 5–15)
BUN: 10 mg/dL (ref 8–23)
CO2: 26 mmol/L (ref 22–32)
Calcium: 8.8 mg/dL — ABNORMAL LOW (ref 8.9–10.3)
Chloride: 109 mmol/L (ref 98–111)
Creatinine, Ser: 1.27 mg/dL — ABNORMAL HIGH (ref 0.61–1.24)
GFR, Estimated: 57 mL/min — ABNORMAL LOW (ref >60)
Glucose, Bld: 85 mg/dL (ref 70–99)
Potassium: 3.6 mmol/L (ref 3.5–5.1)
Sodium: 140 mmol/L (ref 135–145)
Total Bilirubin: 1.7 mg/dL — ABNORMAL HIGH (ref 0.3–1.2)
Total Protein: 6.6 g/dL (ref 6.5–8.1)

## 2020-09-11 LAB — ECHOCARDIOGRAM COMPLETE BUBBLE STUDY
AR max vel: 2.98 cm2
AV Area VTI: 2.73 cm2
AV Area mean vel: 2.79 cm2
AV Mean grad: 3 mmHg
AV Peak grad: 4.4 mmHg
Ao pk vel: 1.05 m/s
Area-P 1/2: 2.48 cm2
Calc EF: 27.1 %
MV M vel: 3.68 m/s
MV Peak grad: 54.2 mmHg
S' Lateral: 4.6 cm
Single Plane A2C EF: 34.5 %
Single Plane A4C EF: 30.9 %

## 2020-09-11 MED ORDER — ATORVASTATIN CALCIUM 40 MG PO TABS
40.0000 mg | ORAL_TABLET | Freq: Every day | ORAL | Status: DC
  Administered 2020-09-11 – 2020-09-17 (×7): 40 mg via ORAL
  Filled 2020-09-11 (×7): qty 1

## 2020-09-11 MED ORDER — ADULT MULTIVITAMIN W/MINERALS CH
1.0000 | ORAL_TABLET | Freq: Every day | ORAL | Status: DC
  Administered 2020-09-12 – 2020-09-17 (×6): 1 via ORAL
  Filled 2020-09-11 (×6): qty 1

## 2020-09-11 MED ORDER — ENSURE ENLIVE PO LIQD
237.0000 mL | Freq: Two times a day (BID) | ORAL | Status: DC
  Administered 2020-09-12 – 2020-09-17 (×9): 237 mL via ORAL

## 2020-09-11 MED ORDER — ASPIRIN EC 81 MG PO TBEC
81.0000 mg | DELAYED_RELEASE_TABLET | Freq: Every day | ORAL | Status: DC
  Administered 2020-09-12 – 2020-09-17 (×6): 81 mg via ORAL
  Filled 2020-09-11 (×6): qty 1

## 2020-09-11 MED ORDER — CLOPIDOGREL BISULFATE 75 MG PO TABS
75.0000 mg | ORAL_TABLET | Freq: Every day | ORAL | Status: DC
  Administered 2020-09-11 – 2020-09-17 (×7): 75 mg via ORAL
  Filled 2020-09-11 (×7): qty 1

## 2020-09-11 MED ORDER — PNEUMOCOCCAL VAC POLYVALENT 25 MCG/0.5ML IJ INJ
0.5000 mL | INJECTION | INTRAMUSCULAR | Status: AC
  Administered 2020-09-13: 0.5 mL via INTRAMUSCULAR
  Filled 2020-09-11: qty 0.5
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
## 2020-09-11 NOTE — Progress Notes (Signed)
Initial Nutrition Assessment  DOCUMENTATION CODES:   Not applicable  INTERVENTION:  Ensure Enlive po BID, each supplement provides 350 kcal and 20 grams of protein  MVI with minerals daily  NUTRITION DIAGNOSIS:   Predicted suboptimal nutrient intake related to acute illness (stroke) as evidenced by other (comment) (consult to assess pt's nutritional status).    GOAL:   Patient will meet greater than or equal to 90% of their needs    MONITOR:   PO intake,Supplement acceptance,Labs,Weight trends,I & O's  REASON FOR ASSESSMENT:   Consult Malnutrition Eval,Other (Comment) (low albumin)  ASSESSMENT:   Pt admitted with acute L anterior MCA stroke. PMH includes OA s/p total R knee replacement.  Pt unavailable at time of RD visit.   Pt with good intake per RN. 100% meal completion x 1 recorded meals.   Reviewed weight history. No significant weight changes noted.   UOP: dcoumented x24 hours  Medications: colace, miralax Labs reviewed.   Diet Order:   Diet Order            Diet regular Room service appropriate? Yes; Fluid consistency: Thin  Diet effective now                 EDUCATION NEEDS:   No education needs have been identified at this time  Skin:  Skin Assessment: Reviewed RN Assessment  Last BM:  PTA  Height:   Ht Readings from Last 1 Encounters:  09/11/20 5\' 11"  (1.803 m)    Weight:   Wt Readings from Last 1 Encounters:  09/11/20 87.1 kg   BMI:  Body mass index is 26.78 kg/m.  Estimated Nutritional Needs:   Kcal:  2000-2200  Protein:  100-110 grams  Fluid:  >2L/d    09/13/20, MS, RD, LDN RD pager number and weekend/on-call pager number located in Amion.

## 2020-09-11 NOTE — Progress Notes (Signed)
Telemetry called and reporrted  pt had  5 beats runs of VT.
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
## 2020-09-11 NOTE — Plan of Care (Signed)
  Problem: Education: Goal: Knowledge of disease or condition will improve Outcome: Adequate for Discharge Goal: Knowledge of secondary prevention will improve Outcome: Adequate for Discharge Goal: Knowledge of patient specific risk factors addressed and post discharge goals established will improve Outcome: Adequate for Discharge   

## 2020-09-11 NOTE — Progress Notes (Signed)
Family Medicine Teaching Service Daily Progress Note Intern Pager: [phone]  Patient name: Travis Mcconnell Medical record number: [id number] Date of birth: [birth year] Age: 80 y.o. Gender: male  Primary Care Provider: Patient, No Pcp Per (Inactive) Consultants: Neuro Code Status: Full  Pt Overview and Major Events to Date:  4/15 Admitted  Assessment and Plan: Travis Mcconnell is a 80 y.o. male presenting with right sided weakness and expressive aphasia, found to have acute stroke in the anterior left MCA. 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   Acute left anterior MCA stroke  Right arm strength improved from admission exam in ED. Currently on asa only per neuro.  - neuro consulted, appreciate care - asa only per neuro (NO plavix given petechia on MR brain) - f/u ECHO - routine neuro checks - NS 100 mL/hr - SLP eval - PT/OT - vitals per unit routine - fall precautions  Osteoarthritis of right knee Holding home norco and robaxin at this time. Will continue to re-assess.   Chronic constipation Holding home miralax and docusate until cleared for diet by SLP.    FEN/GI: NPO PPx: Lovenox   Status is: Observation  The patient remains OBS appropriate and will d/c before 2 midnights.  Dispo: The patient is from: Home              Anticipated d/c is to: SNF              Patient currently is not medically stable to d/c.   Difficult to place patient No   Subjective:  Patient laying awake in bed, no acute distress. No pain. Can answer yes and no along with a couple words. Right arm already stronger than on admission.   Objective: Temp:  [97.6 F (36.4 C)-98.8 F (37.1 C)] 98.2 F (36.8 C) (04/16 0500) Pulse Rate:  [67-89] 75 (04/16 0635) Resp:  [12-27] 18 (04/16 0635) BP: (143-185)/(97-122) 164/99 (04/16 0635) SpO2:  [94 %-100 %] 96 % (04/16 0635) Weight:  [87.1 kg] 87.1 kg (04/16 0512) Physical Exam: General: awake, alert, no  acute distress Neuro: able to smile evenly, puff cheeks, shrug shoulders (right lagging below left), grip strength 5/5 and equal, RUE 4/5 (improved from admission), LUE 5/5, BLE 5/5 and equal  Laboratory: Recent Labs  Lab 09/10/20 1840 09/10/20 1843 09/11/20 0228  WBC 5.0  --  4.5  HGB 15.0 15.6 14.5  HCT 46.1 46.0 44.6  PLT 189  --  181   Recent Labs  Lab 09/10/20 1840 09/10/20 1843 09/11/20 0228  NA 141 143 140  K 4.1 4.5 3.6  CL 110 110 109  CO2 23  --  26  BUN 14 19 10   CREATININE 1.45* 1.40* 1.27*  CALCIUM 9.1  --  8.8*  PROT 7.3  --  6.6  BILITOT 1.1  --  1.7*  ALKPHOS 90  --  79  ALT 13  --  13  AST 21  --  16  GLUCOSE 93 90 85    Imaging/Diagnostic Tests: MRI HEAD WITHOUT CONTRAST 09/11/2020 IMPRESSION: 1. Multifocal acute ischemia within the anterior left MCA territory with petechial blood products ( Heidelberg classification 1b: HI2, confluent petechiae, no mass effect). 2. Moderate chronic small vessel ischemic changes.   09/13/2020, MD 09/11/2020, 8:41 AM PGY-1, [facility] FPTS Intern pager: [phone], text pages welcome

## 2020-09-11 NOTE — Progress Notes (Signed)
Bilateral lower extremity venous duplex completed. Refer to "CV Proc" under chart review to view preliminary results.  09/11/2020 12:49 PM Eula Fried., MHA, RVT, RDCS, RDMS
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
## 2020-09-11 NOTE — Hospital Course (Addendum)
Travis Mcconnell is a 80 y.o. male presenting with right sided weakness and expressive aphasia, found to have acute stroke in the anterior left MCA. 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   Patient began experiencing right sided weakness and expressive aphasia shortly after lunch on 4/15. Drove himself to the hospital. In the ED, pt was hypertensive but otherwise stable on room air. CBC unremarkable. CMP notable for creatinine 1.40 (baseline per chart review appears to be 1.2-1.4) and ionized calcium 1.09. Glucose 90, UDS negative, ETOH <10.  Respiratory labs negative for COVID and Flu A/B. CT head wo contrast demonstrated acute cortical/subcortical infarct within mid left frontal lobe, along with other chronic infarcts, chronic small vessel ischemic disease, and mild cerebral atrophy. 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 CTA neck shows patent carotid arteries and biapical bullous and paraseptal emphysema. CT head perfusion study shows hypoperfusion in inferior right cerebellum and medulla, measured at mismatch of 7 mL. Neurology consulted, recommended starting ASA. MR brain demonstrated multifocal acute ischemia within the anterior left MCA territory with petechial blood products and moderate chronic small vessel ischemic changes. Neurology evaluated and recommended continuing aspirin. Recommended asa and plavix for 3 weeks followed by aspirin only. Did experience intermittent runs of V. Tach overnight; cardiology and EP consulted, started metoprolol and inplanted loop recorder prior to discharge. Patient made great strides in right arm/hand strength, grip, and use along with dysarthria while admitted and working with inpatient PT/OT/SLP. Hopeful for good recovery. Patient to follow up with neuro in 4 weeks.

## 2020-09-12 DIAGNOSIS — I63412 Cerebral infarction due to embolism of left middle cerebral artery: Secondary | ICD-10-CM | POA: Diagnosis not present

## 2020-09-12 DIAGNOSIS — E7800 Pure hypercholesterolemia, unspecified: Secondary | ICD-10-CM | POA: Diagnosis not present

## 2020-09-12 LAB — CBC WITH DIFFERENTIAL/PLATELET
Abs Immature Granulocytes: 0.01 10*3/uL (ref 0.00–0.07)
Basophils Absolute: 0 10*3/uL (ref 0.0–0.1)
Basophils Relative: 1 %
Eosinophils Absolute: 0.1 10*3/uL (ref 0.0–0.5)
Eosinophils Relative: 2 %
HCT: 46.2 % (ref 39.0–52.0)
Hemoglobin: 15.4 g/dL (ref 13.0–17.0)
Immature Granulocytes: 0 %
Lymphocytes Relative: 30 %
Lymphs Abs: 1.3 10*3/uL (ref 0.7–4.0)
MCH: 28.6 pg (ref 26.0–34.0)
MCHC: 33.3 g/dL (ref 30.0–36.0)
MCV: 85.9 fL (ref 80.0–100.0)
Monocytes Absolute: 0.6 10*3/uL (ref 0.1–1.0)
Monocytes Relative: 14 %
Neutro Abs: 2.2 10*3/uL (ref 1.7–7.7)
Neutrophils Relative %: 53 %
Platelets: 199 10*3/uL (ref 150–400)
RBC: 5.38 MIL/uL (ref 4.22–5.81)
RDW: 13.9 % (ref 11.5–15.5)
WBC: 4.2 10*3/uL (ref 4.0–10.5)
nRBC: 0 % (ref 0.0–0.2)

## 2020-09-12 LAB — BASIC METABOLIC PANEL
Anion gap: 5 (ref 5–15)
BUN: 12 mg/dL (ref 8–23)
CO2: 26 mmol/L (ref 22–32)
Calcium: 8.9 mg/dL (ref 8.9–10.3)
Chloride: 107 mmol/L (ref 98–111)
Creatinine, Ser: 1.18 mg/dL (ref 0.61–1.24)
GFR, Estimated: >60 mL/min (ref >60)
Glucose, Bld: 104 mg/dL — ABNORMAL HIGH (ref 70–99)
Potassium: 4 mmol/L (ref 3.5–5.1)
Sodium: 138 mmol/L (ref 135–145)

## 2020-09-12 MED ORDER — ENOXAPARIN SODIUM 40 MG/0.4ML ~~LOC~~ SOLN
40.0000 mg | Freq: Every day | SUBCUTANEOUS | Status: DC
  Administered 2020-09-12 – 2020-09-16 (×5): 40 mg via SUBCUTANEOUS
  Filled 2020-09-12 (×5): qty 0.4

## 2020-09-12 NOTE — Progress Notes (Signed)
Family Medicine Teaching Service Daily Progress Note Intern Pager: [phone]  Patient name: Travis Mcconnell Medical record number: [id number] Date of birth: 16-Nov-1940 Age: 80 y.o. Gender: male  Primary Care Provider: Patient, No Pcp Per (Inactive) Consultants: Neuro Code Status: Full Code   Pt Overview and Major Events to Date:  4/16: admitted with Left anterior MCA ischemia 4/17: ASA and Plavix continued  Assessment and Plan: Travis Mcconnell a 79 y.o.malepresenting with right sided weakness and expressive aphasia, found to have acute stroke in the anterior left MCA. PMH is significant forosteoarthritis of right knee s/p total R knee replacement.   Acute left anterior MCA stroke  Patient reports that weakness feels the same since admission. He denies HA or blurry vision at this time. On exam, right upper extremity strength slightly decreased in comparison to the left.  Patient has passed swallow study and been ordered regular diet.  Repeat CBC and CMP within normal limits. - neuro consulted, appreciate care -Neurology recommendations updated to include aspirin and Plavix daily for 3 weeks, followed by aspirin 81 only; also recommended loop recorder to evaluate for atrial fibrillation - routine neuro checks -Discontinue NS 100 mL/hr -We will request for repeat PT/OT evaluation - vitals per unit routine - fall precautions  Chronic constipation -Restart home miralax and docusate until cleared for diet by SLP.    FEN/GI:  Regular diet  PPx: Lovenox  Status is: Inpatient  Remains inpatient appropriate because:Ongoing diagnostic testing needed not appropriate for outpatient work up   Dispo:  Patient From: Home  Planned Disposition: Skilled nursing facility  Medically stable for discharge: No      Subjective:  Patient reports that weakness feels "the same" since admission.  He denies any headache or blurry vision.  Patient denies any difficulty breathing or swallowing.  He  states that he has been able to drink some fluids.  Objective: Temp:  [97.6 F (36.4 C)-98.2 F (36.8 C)] 98 F (36.7 C) (04/17 1119) Pulse Rate:  [72-92] 79 (04/17 1119) Resp:  [15-27] 22 (04/17 1119) BP: (143-159)/(96-110) 158/100 (04/17 1119) SpO2:  [95 %-99 %] 98 % (04/17 1119)  Physical Exam: General: Male appearing stated age, sitting upright in bed in no acute distress Cardiovascular: Regular rate and rhythm Respiratory: Clear to auscultation without wheezing, no crackles, normal work of breathing on room air Abdomen: Soft, nontender, bowel sounds Extremities: No lower extremity edema  Laboratory: Recent Labs  Lab 09/10/20 1840 09/10/20 1843 09/11/20 0228 09/12/20 1025  WBC 5.0  --  4.5 4.2  HGB 15.0 15.6 14.5 15.4  HCT 46.1 46.0 44.6 46.2  PLT 189  --  181 199   Recent Labs  Lab 09/10/20 1840 09/10/20 1843 09/11/20 0228 09/12/20 1025  NA 141 143 140 138  K 4.1 4.5 3.6 4.0  CL 110 110 109 107  CO2 23  --  26 26  BUN 14 19 10 12   CREATININE 1.45* 1.40* 1.27* 1.18  CALCIUM 9.1  --  8.8* 8.9  PROT 7.3  --  6.6  --   BILITOT 1.1  --  1.7*  --   ALKPHOS 90  --  79  --   ALT 13  --  13  --   AST 21  --  16  --   GLUCOSE 93 90 85 104*    Imaging/Diagnostic Tests: No new imaging since 09/11/2020  09/13/2020, MD 09/12/2020, 2:16 PM PGY-2, [facility] FPT[facility] Intern pager: [phone], text pages welcome
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
## 2020-09-12 NOTE — Progress Notes (Signed)
STROKE TEAM PROGRESS NOTE   INTERVAL HISTORY RN and wife at the bedside. Pt continued to have expressive aphasia and right UE proximal weakness, unchanged from yesterday. PT/OT recommend [facility]. Pt wife stated that they are separated and pt lives alone. Discussed with family medicine and will consult social worker and plan for loop recorder before discharge.   Vitals:   09/12/20 0200 09/12/20 0300 09/12/20 0422 09/12/20 0829  BP:   (!) 147/96 (!) 159/99  Pulse: 82 77 88 83  Resp: (!) 24 (!) 21 18 (!) 23  Temp:   98.2 F (36.8 C) 97.7 F (36.5 C)  TempSrc:   Oral Oral  SpO2: 98% 95% 99% 98%  Weight:      Height:       CBC:  Recent Labs  Lab 09/10/20 1840 09/10/20 1843 09/11/20 0228 09/12/20 1025  WBC 5.0  --  4.5 4.2  NEUTROABS 2.6  --   --  2.2  HGB 15.0   < > 14.5 15.4  HCT 46.1   < > 44.6 46.2  MCV 87.0  --  87.8 85.9  PLT 189  --  181 199   < > = values in this interval not displayed.   Basic Metabolic Panel:  Recent Labs  Lab 09/11/20 0228 09/12/20 1025  NA 140 138  K 3.6 4.0  CL 109 107  CO2 26 26  GLUCOSE 85 104*  BUN 10 12  CREATININE 1.27* 1.18  CALCIUM 8.8* 8.9   Lipid Panel:  Recent Labs  Lab 09/11/20 0228  CHOL 173  TRIG 36  HDL 59  CHOLHDL 2.9  VLDL 7  LDLCALC 107*   HgbA1c: No results for input(s): HGBA1C in the last 168 hours. Urine Drug Screen:  Recent Labs  Lab 09/10/20 2014  LABOPIA NONE DETECTED  COCAINSCRNUR NONE DETECTED  LABBENZ NONE DETECTED  AMPHETMU NONE DETECTED  THCU NONE DETECTED  LABBARB NONE DETECTED    Alcohol Level  Recent Labs  Lab 09/10/20 1840  ETH <10    IMAGING past 24 hours ECHOCARDIOGRAM COMPLETE BUBBLE STUDY  Result Date: 09/11/2020    ECHOCARDIOGRAM REPORT   Patient Name:   Tremane M Brazier Date of Exam: 09/11/2020 Medical Rec #:  [id number]    Height:       71.0 in Accession #:    [id number]   Weight:       192.0 lb Date of Birth:  08/19/1940     BSA:          2.072 m Patient Age:    80 years     BP:            157/111 mmHg Patient Gender: M            HR:           85 bpm. Exam Location:  Inpatient Procedure: 2D Echo, Cardiac Doppler and Color Doppler Indications:    Stroke  History:        Patient has no prior history of Echocardiogram examinations.  Sonographer:    Gertie Fey MHA, RDMS, RVT, RDCS Referring Phys: [id number] [facility] [place]  Sonographer Comments: No subcostal window. Image acquisition challenging due to respiratory motion. IMPRESSIONS  1. Left ventricular ejection fraction, by estimation, is 50 to 55%. The left ventricle has low normal function. The left ventricle demonstrates regional wall motion abnormalities (see scoring diagram/findings for description). The left ventricular internal cavity size was moderately dilated. Left ventricular diastolic parameters are consistent with  Grade I diastolic dysfunction (impaired relaxation).  2. Right ventricular systolic function is normal. The right ventricular size is normal.  3. The mitral valve is normal in structure. Mild mitral valve regurgitation.  4. The aortic valve is normal in structure. Aortic valve regurgitation is not visualized. FINDINGS  Left Ventricle: Left ventricular ejection fraction, by estimation, is 50 to 55%. The left ventricle has low normal function. The left ventricle demonstrates regional wall motion abnormalities. The left ventricular internal cavity size was moderately dilated. There is no left ventricular hypertrophy. Left ventricular diastolic parameters are consistent with Grade I diastolic dysfunction (impaired relaxation).  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 The basal inferoseptal segment is dyskinetic. The apex is akinetic. The apical septal segment and apical inferior segment are hypokinetic. The entire anterior wall, entire lateral wall, anterior septum, mid inferoseptal segment, and mid inferior segment are normal. Right Ventricle: The right ventricular size is normal. Right vetricular  wall thickness was not assessed. Right ventricular systolic function is normal. Left Atrium: Left atrial size was normal in size. Right Atrium: Right atrial size was normal in size. Pericardium: There is no evidence of pericardial effusion. Mitral Valve: The mitral valve is normal in structure. Mild mitral valve regurgitation. Tricuspid Valve: The tricuspid valve is normal in structure. Tricuspid valve regurgitation is mild. Aortic Valve: The aortic valve is normal in structure. Aortic valve regurgitation is not visualized. Aortic valve mean gradient measures 3.0 mmHg. Aortic valve peak gradient measures 4.4 mmHg. Aortic valve area, by VTI measures 2.73 cm. Pulmonic Valve: The pulmonic valve was normal in structure. Pulmonic valve regurgitation is not visualized. Aorta: The aortic root is normal in size and structure. IAS/Shunts: No atrial level shunt detected by color flow Doppler. Agitated saline contrast was given intravenously to evaluate for intracardiac shunting.  LEFT VENTRICLE PLAX 2D LVIDd:         5.80 cm     Diastology LVIDs:         4.60 cm     LV e' medial:    5.51 cm/s LV PW:         0.90 cm     LV E/e' medial:  9.3 LV IVS:        0.90 cm     LV e' lateral:   5.19 cm/s LVOT diam:     2.30 cm     LV E/e' lateral: 9.9 LV SV:         49 LV SV Index:   23 LVOT Area:     4.15 cm  LV Volumes (MOD) LV vol d, MOD A2C: 46.9 ml LV vol d, MOD A4C: 95.5 ml LV vol s, MOD A2C: 30.7 ml LV vol s, MOD A4C: 66.0 ml LV SV MOD A2C:     16.2 ml LV SV MOD A4C:     95.5 ml LV SV MOD BP:      19.1 ml RIGHT VENTRICLE TAPSE (M-mode): 1.5 cm LEFT ATRIUM              Index       RIGHT ATRIUM           Index LA diam:        3.80 cm  1.83 cm/m  RA Area:     16.10 cm LA Vol (A2C):   59.3 ml  28.61 ml/m RA Volume:   32.30 ml  15.59 ml/m LA Vol (A4C):   118.0 ml 56.94 ml/m LA Biplane Vol: 90.1 ml  43.48 ml/m  AORTIC VALVE AV Area (Vmax):    2.98 cm AV Area (Vmean):   2.79 cm AV Area (VTI):     2.73 cm AV Vmax:            105.00 cm/s AV Vmean:          78.200 cm/s AV VTI:            0.178 m AV Peak Grad:      4.4 mmHg AV Mean Grad:      3.0 mmHg LVOT Vmax:         75.40 cm/s LVOT Vmean:        52.500 cm/s LVOT VTI:          0.117 m LVOT/AV VTI ratio: 0.66  AORTA Ao Root diam: 3.50 cm MITRAL VALVE               TRICUSPID VALVE MV Area (PHT): 2.48 cm    TR Peak grad:   21.3 mmHg MV Decel Time: 306 msec    TR Vmax:        231.00 cm/s MR Peak grad: 54.2 mmHg MR Vmax:      368.00 cm/s  SHUNTS MV E velocity: 51.40 cm/s  Systemic VTI:  0.12 m MV A velocity: 69.40 cm/s  Systemic Diam: 2.30 cm MV E/A ratio:  0.74 Dietrich PatesPaula Ross MD Electronically signed by Dietrich PatesPaula Ross MD Signature Date/Time: 09/11/2020/1:56:33 PM    Final    VAS US LOWER EXTREMITY VENOUS (DVT)  Result Date: 09/12/2020  Lower Venous DVT Study Indications: Stroke.  Comparison Study: No prior study Performing Technologist: Gertie FeyMichelle Simonetti MHA, RDMS, RVT, RDCS  Examination Guidelines: A complete evaluation includes B-mode imaging, spectral Doppler, color Doppler, and power Doppler as needed of all accessible portions of each vessel. Bilateral testing is considered an integral part of a complete examination. Limited examinations for reoccurring indications may be performed as noted. The reflux portion of the exam is performed with the patient in reverse Trendelenburg.  +---------+---------------+---------+-----------+----------+--------------+ RIGHT    CompressibilityPhasicitySpontaneityPropertiesThrombus Aging +---------+---------------+---------+-----------+----------+--------------+ CFV      Full           Yes      Yes                                 +---------+---------------+---------+-----------+----------+--------------+ SFJ      Full                                                        +---------+---------------+---------+-----------+----------+--------------+ FV Prox  Full                                                         +---------+---------------+---------+-----------+----------+--------------+ FV Mid   Full                                                        +---------+---------------+---------+-----------+----------+--------------+ FV DistalFull                                                        +---------+---------------+---------+-----------+----------+--------------+  PFV      Full                                                        +---------+---------------+---------+-----------+----------+--------------+ POP      Full           Yes      Yes                                 +---------+---------------+---------+-----------+----------+--------------+ PTV      Full                                                        +---------+---------------+---------+-----------+----------+--------------+ PERO     Full                                                        +---------+---------------+---------+-----------+----------+--------------+   +---------+---------------+---------+-----------+----------+--------------+ LEFT     CompressibilityPhasicitySpontaneityPropertiesThrombus Aging +---------+---------------+---------+-----------+----------+--------------+ CFV      Full           Yes      Yes                                 +---------+---------------+---------+-----------+----------+--------------+ SFJ      Full                                                        +---------+---------------+---------+-----------+----------+--------------+ FV Prox  Full                                                        +---------+---------------+---------+-----------+----------+--------------+ FV Mid   Full                                                        +---------+---------------+---------+-----------+----------+--------------+ FV DistalFull                                                         +---------+---------------+---------+-----------+----------+--------------+ PFV      Full                                                        +---------+---------------+---------+-----------+----------+--------------+   POP      Full           Yes      Yes                                 +---------+---------------+---------+-----------+----------+--------------+ PTV      Full                                                        +---------+---------------+---------+-----------+----------+--------------+ PERO     Full                                                        +---------+---------------+---------+-----------+----------+--------------+     Summary: RIGHT: - There is no evidence of deep vein thrombosis in the lower extremity.  - No cystic structure found in the popliteal fossa.  LEFT: - There is no evidence of deep vein thrombosis in the lower extremity.  - No cystic structure found in the popliteal fossa.  *See table(s) above for measurements and observations. Electronically signed by Lemar Livings MD on 09/12/2020 at 9:00:32 AM.    Final     PHYSICAL EXAM  Temp:  [97.6 F (36.4 C)-98.2 F (36.8 C)] 98 F (36.7 C) (04/17 1119) Pulse Rate:  [72-92] 79 (04/17 1119) Resp:  [15-27] 22 (04/17 1119) BP: (143-159)/(96-128) 158/100 (04/17 1119) SpO2:  [95 %-99 %] 98 % (04/17 1119)  General - Well nourished, well developed, in no apparent distress.  Ophthalmologic - fundi not visualized due to noncooperation.  Cardiovascular - Regular rhythm and rate.  Neuro - awake, alert, eyes open, expressive aphasia, not able to answer orientation questions, not able to finish sentences, but able to repeat simple sentences and name 1/3. Following all simple commands. No gaze palsy, tracking bilaterally, visual field full, PERRL. Mild right facial droop. Tongue midline. LUEs 5/5, but RUE proximal 3-/5, bicep tricep and finger grip 4+/5. Bilaterally LEs 5/5, no drift. Sensation  decreased on the right UE and LE, left FTN intact, gait not tested.   ASSESSMENT/PLAN Travis Mcconnell is a 80 y.o. 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 He presented to the ED for acute onset of right sided weakness and expressive aphasia shortly after lunch today. Drove himself to the hospital. In the EDCT head wo contrast demonstrated acute cortical/subcortical infarct within mid left frontal lobe, along with other chronic infarcts, chronic small vessel ischemic disease, and mild cerebral atrophy. 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 CTA neck shows patent carotid arteries and biapical bullous and paraseptal emphysema. CT head perfusion study shows hypoperfusion in inferior right cerebellum and medulla, measured at mismatch of 7 mL. .   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   Code Stroke CT head Acute cortical/subcortical infarct within the mid left frontal lobe. Chronic lacunar infarcts within the bilateral corona radiata and basal ganglia/internal capsules bilaterally.  CTA head and neck showed left P1 moderate stenosis,  but no left ICA or MCA stenosis.    MRI confirmed left frontal infarct.    EF 50 to 55%   DVT negative.    Recommend loop recorder to rule out afib before discharge.   LDL 107  HgbA1c pending   UDS neg  VTE prophylaxis - SCD's, lovenox   No antithrombotic prior to admission, now on aspirin 81 mg and plavix DAPT daily for 3 weeks and then ASA alone.   Therapy recommendations:  HH PT/OT  Disposition:  Pending, pt lives alone, will consult social worker  Hypertension  Home meds:  None  Stable . Long-term BP goal normotensive  Hyperlipidemia   Home meds:  none  LDL 107, goal < 70  Add atorvastatin 40mg  daily    Continue statin at discharge  Other Stroke Risk Factors  Advanced Age >/= 85   Former smoker  ETOH use, alcohol level <10, advised to drink no more than 1 drink(s) a day  Hospital day # 1   75, MD PhD Stroke Neurology 09/12/2020 11:13 AM   To contact Stroke Continuity provider, please refer to 09/14/2020. After hours, contact General Neurology

## 2020-09-13 DIAGNOSIS — E7800 Pure hypercholesterolemia, unspecified: Secondary | ICD-10-CM | POA: Diagnosis not present

## 2020-09-13 DIAGNOSIS — I63412 Cerebral infarction due to embolism of left middle cerebral artery: Secondary | ICD-10-CM | POA: Diagnosis not present

## 2020-09-13 LAB — CBC
HCT: 45.1 % (ref 39.0–52.0)
Hemoglobin: 15.2 g/dL (ref 13.0–17.0)
MCH: 28.2 pg (ref 26.0–34.0)
MCHC: 33.7 g/dL (ref 30.0–36.0)
MCV: 83.7 fL (ref 80.0–100.0)
Platelets: 196 10*3/uL (ref 150–400)
RBC: 5.39 MIL/uL (ref 4.22–5.81)
RDW: 13.8 % (ref 11.5–15.5)
WBC: 4.3 10*3/uL (ref 4.0–10.5)
nRBC: 0 % (ref 0.0–0.2)

## 2020-09-13 LAB — BASIC METABOLIC PANEL
Anion gap: 8 (ref 5–15)
BUN: 16 mg/dL (ref 8–23)
CO2: 24 mmol/L (ref 22–32)
Calcium: 9.2 mg/dL (ref 8.9–10.3)
Chloride: 106 mmol/L (ref 98–111)
Creatinine, Ser: 1.43 mg/dL — ABNORMAL HIGH (ref 0.61–1.24)
GFR, Estimated: 50 mL/min — ABNORMAL LOW (ref >60)
Glucose, Bld: 122 mg/dL — ABNORMAL HIGH (ref 70–99)
Potassium: 4 mmol/L (ref 3.5–5.1)
Sodium: 138 mmol/L (ref 135–145)

## 2020-09-13 LAB — HEMOGLOBIN A1C
Hgb A1c MFr Bld: 6.1 % — ABNORMAL HIGH (ref 4.8–5.6)
Hgb A1c MFr Bld: 6.1 % — ABNORMAL HIGH (ref 4.8–5.6)
Mean Plasma Glucose: 128 mg/dL
Mean Plasma Glucose: 128 mg/dL

## 2020-09-13 LAB — MAGNESIUM: Magnesium: 1.9 mg/dL (ref 1.7–2.4)

## 2020-09-13 NOTE — [place] FL2 (Signed)
[place] MEDICAID FL2 LEVEL OF CARE SCREENING TOOL     IDENTIFICATION  Patient Name: Travis Mcconnell Birthdate: 02-27-1941 Sex: male Admission Date (Current Location): 09/10/2020  [facility] and [place] Number:  Producer, television/film/video and Address:  [facility], [address], [place], [place] [address]      Provider Number: [id number]  Attending Physician Name and Address:  Doreene Eland, MD  Relative Name and Phone Number:       Current Level of Care: Hospital Recommended Level of Care: Skilled Nursing Facility Prior Approval Number:    Date Approved/Denied:   PASRR Number: [id number]  Discharge Plan: SNF    Current Diagnoses: Patient Active Problem List   Diagnosis Date Noted  . Stroke (HCC) 09/10/2020  . Osteoarthritis of right knee 12/11/2019  . 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/11/2019    Orientation RESPIRATION BLADDER Height & Weight     Self,Place  Normal Continent Weight: 192 lb 0.3 oz (87.1 kg) Height:  5\' 11"  (180.3 cm)  BEHAVIORAL SYMPTOMS/MOOD NEUROLOGICAL BOWEL NUTRITION STATUS      Continent Diet (see DC summary)  AMBULATORY STATUS COMMUNICATION OF NEEDS Skin   Limited Assist Verbally Normal                       Personal Care Assistance Level of Assistance  Bathing,Feeding,Dressing Bathing Assistance: Limited assistance Feeding assistance: Limited assistance Dressing Assistance: Limited assistance     Functional Limitations Info  Speech     Speech Info: Impaired (dysarthria)    SPECIAL CARE FACTORS FREQUENCY  PT (By licensed PT),OT (By licensed OT),Speech therapy     PT Frequency: 5x/wk OT Frequency: 5x/wk     Speech Therapy Frequency: 5x/wk      Contractures Contractures Info: Not present    Additional Factors Info  Code Status,Allergies Code Status Info: Full Allergies Info: Dewitts Pain Reliever (Magnesium Salicylate), Other, Tramadol           Current Medications (09/13/2020):   This is the current hospital active medication list Current Facility-Administered Medications  Medication Dose Route Frequency Provider Last Rate Last Admin  . aspirin EC tablet 81 mg  81 mg Oral Daily 09/15/2020, MD   81 mg at 09/13/20 1102  . atorvastatin (LIPITOR) tablet 40 mg  40 mg Oral Daily 09/15/20 A, NP   40 mg at 09/13/20 1102  . clopidogrel (PLAVIX) tablet 75 mg  75 mg Oral Daily Hammons, Kimberly B, RPH   75 mg at 09/13/20 1102  . docusate sodium (COLACE) capsule 100 mg  100 mg Oral BID 09/15/20, MD   100 mg at 09/12/20 2218  . enoxaparin (LOVENOX) injection 40 mg  40 mg Subcutaneous QHS Simmons-Robinson, Makiera, MD   40 mg at 09/12/20 2218  . feeding supplement (ENSURE ENLIVE / ENSURE PLUS) liquid 237 mL  237 mL Oral BID BM Eniola, Kehinde T, MD   237 mL at 09/13/20 1102  . multivitamin with minerals tablet 1 tablet  1 tablet Oral Daily 09/15/20, MD   1 tablet at 09/13/20 1102  . polyethylene glycol (MIRALAX / GLYCOLAX) packet 17 g  17 g Oral BID 09/15/20, MD   17 g at 09/12/20 2218     Discharge Medications: Please see discharge summary for a list of discharge medications.  Relevant Imaging Results:  Relevant Lab Results:   Additional Information SS#: [id number]  [id number], LCSW

## 2020-09-13 NOTE — Progress Notes (Signed)
STROKE TEAM PROGRESS NOTE   INTERVAL HISTORY RN and wife at the bedside. Pt continued to have expressive aphasia and right UE proximal weakness, unchanged from yesterday. PT/OT recommend [facility] PT/OT. Pt wife stated that they are separated and pt lives alone. Discussed with family medicine and will consult social worker and plan for loop recorder before discharge.   Vitals:   09/12/20 1533 09/12/20 2103 09/13/20 0327 09/13/20 0735  BP: (!) 164/101 (!) 164/95 (!) 139/98 (!) 149/97  Pulse: 80 80 75 86  Resp: 18 16 (!) 22 20  Temp: 98.5 F (36.9 C) 98.1 F (36.7 C) 97.7 F (36.5 C) 98.4 F (36.9 C)  TempSrc: Oral Oral Oral Oral  SpO2: 98% 99% 98% 100%  Weight:      Height:       CBC:  Recent Labs  Lab 09/10/20 1840 09/10/20 1843 09/11/20 0228 09/12/20 1025  WBC 5.0  --  4.5 4.2  NEUTROABS 2.6  --   --  2.2  HGB 15.0   < > 14.5 15.4  HCT 46.1   < > 44.6 46.2  MCV 87.0  --  87.8 85.9  PLT 189  --  181 199   < > = values in this interval not displayed.   Basic Metabolic Panel:  Recent Labs  Lab 09/11/20 0228 09/12/20 1025  NA 140 138  K 3.6 4.0  CL 109 107  CO2 26 26  GLUCOSE 85 104*  BUN 10 12  CREATININE 1.27* 1.18  CALCIUM 8.8* 8.9   Lipid Panel:  Recent Labs  Lab 09/11/20 0228  CHOL 173  TRIG 36  HDL 59  CHOLHDL 2.9  VLDL 7  LDLCALC 107*   HgbA1c:  Recent Labs  Lab 09/12/20 1029  HGBA1C 6.1*   Urine Drug Screen:  Recent Labs  Lab 09/10/20 2014  LABOPIA NONE DETECTED  COCAINSCRNUR NONE DETECTED  LABBENZ NONE DETECTED  AMPHETMU NONE DETECTED  THCU NONE DETECTED  LABBARB NONE DETECTED    Alcohol Level  Recent Labs  Lab 09/10/20 1840  ETH <10    IMAGING past 24 hours No results found.  PHYSICAL EXAM  Temp:  [97.7 F (36.5 C)-98.5 F (36.9 C)] 98.4 F (36.9 C) (04/18 0735) Pulse Rate:  [75-86] 86 (04/18 0735) Resp:  [16-22] 20 (04/18 0735) BP: (139-164)/(95-101) 149/97 (04/18 0735) SpO2:  [98 %-100 %] 100 % (04/18 0735)  General -  Well nourished, well developed, in no apparent distress.  Ophthalmologic - fundi not visualized due to noncooperation.  Cardiovascular - Regular rhythm and rate.  Neuro - awake, alert, eyes open, expressive aphasia, not able to answer orientation questions, not able to finish sentences, but able to repeat simple sentences and name 2/3. Following all simple commands. No gaze palsy, tracking bilaterally, visual field full, PERRL. Mild right facial droop. Tongue midline. LUEs 5/5, but RUE proximal 3-/5, bicep tricep and finger grip 4+/5. Bilaterally LEs 5/5, no drift. Sensation decreased on the right UE and LE, left FTN intact, gait not tested.   ASSESSMENT/PLAN [name] is a 80 y.o. 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 He presented to the ED for acute onset of right sided weakness and expressive aphasia shortly after lunch today. Drove himself to the hospital. In the EDCT head wo contrast demonstrated acute cortical/subcortical infarct within mid left frontal lobe, along with other chronic infarcts, chronic small vessel ischemic disease, and mild cerebral atrophy. CTA head indicates moderate stenosis  of P1 left posterior cerebral artery and mild to moderate atherosclerotic narrowing or paraclinoid internal carotid arteries bilaterally. CTA neck shows patent carotid arteries and biapical bullous and paraseptal emphysema. CT head perfusion study shows hypoperfusion in inferior right cerebellum and medulla, measured at mismatch of 7 mL. .   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   Code Stroke CT head Acute cortical/subcortical infarct within the mid left frontal lobe. Chronic lacunar infarcts within the bilateral corona radiata and basal ganglia/internal capsules bilaterally.  CTA head and neck showed left P1 moderate stenosis, but no left ICA or MCA stenosis.    MRI confirmed left frontal  infarct.    EF 50 to 55%   DVT negative.    Recommend loop recorder to rule out afib before discharge. EP aware  LDL 107  HgbA1c 6.1   UDS neg  VTE prophylaxis - SCD's, lovenox   No antithrombotic prior to admission, now on aspirin 81 mg and plavix DAPT daily for 3 weeks and then ASA alone.   Therapy recommendations:  SNF  Disposition:  Pending   Hypertension  Home meds:  None  Stable . Long-term BP goal normotensive  Hyperlipidemia   Home meds:  none  LDL 107, goal < 70  Add atorvastatin 40mg  daily   Continue statin at discharge  Other Stroke Risk Factors  Advanced Age >/= 30   Former smoker  ETOH use, alcohol level <10, advised to drink no more than 1 drink(s) a day  Hospital day # 2  Neurology will sign off. Please call with questions. Pt will follow up with stroke clinic NP at [facility] in about 4 weeks. Thanks for the consult.   [facility], MD PhD Stroke Neurology 09/13/2020 10:38 AM   To contact Stroke Continuity provider, please refer to 09/15/2020. After hours, contact General Neurology

## 2020-09-13 NOTE — Progress Notes (Signed)
Family Medicine Teaching Service Daily Progress Note Intern Pager: [phone]  Patient name: Travis Mcconnell Medical record number: [id number] Date of birth: Mar 09, 1941 Age: 80 y.o. Gender: male  Primary Care Provider: Patient, No Pcp Per (Inactive) Consultants: Neuro (s/o) Code Status: Full  Pt Overview and Major Events to Date:  4/16 Admitted, acute stroke of left anterior MCA territory, asa only 4/17 Started plavix in addition to asa  Assessment and Plan: Travis Mcconnell a 79 y.o.malepresenting with right sided weakness and expressive aphasia, found to have acutestroke on the anterior left MCA. PMH is significant forosteoarthritis of right knee s/p total R knee replacement.  Acute left anterior MCA stroke  Right arm strength greatly improved since admission, now 5/5 and equal to left arm. Right leg strength still 4/5. Making strides with SLP, who believes Travis Mcconnell will be an excellent candidate for outpatient speech rehabilitation. Neurology stroke team signed off today, recommend follow up in 4 weeks with Stroke Team NP.  - cards to coordinate loop recorder as outpatient - asa and plavix x3 weeks, then after asa only - routine neuro checks - vitals per unit routine - fall precautions  Chronic constipation Continue home miralax and docusate.    FEN/GI: Regular diet PPx: Lovenox   Status is: Inpatient  Remains inpatient appropriate because:Inpatient level of care appropriate due to severity of illness   Dispo:  Patient From: Home  Planned Disposition: Skilled Nursing Facility  Medically stable for discharge: No     Subjective:  Right arm strength greatly improved since admission, now 5/5 and equal to left arm. Right leg strength still 4/5. Making strides with SLP, who believes Travis Mcconnell will be an excellent candidate for outpatient speech rehabilitation. Travis Mcconnell does not feel like his arm is back to normal yet. No complaints.   Objective: Temp:  [97.7 F (36.5 C)-98.6  F (37 C)] 98.6 F (37 C) (04/18 1227) Pulse Rate:  [75-86] 80 (04/18 1227) Resp:  [16-22] 20 (04/18 1227) BP: (129-164)/(91-101) 129/91 (04/18 1227) SpO2:  [98 %-100 %] 100 % (04/18 1227) Physical Exam: General: awake, alert, oriented, able to communicate more today, no distress Cardiovascular: RRR, no murmur Respiratory: CTAB Extremities: BUE 5/5 and equal, RLE 4/5, LLE 5/5, grip strength 5/5 and equal  Laboratory: Recent Labs  Lab 09/11/20 0228 09/12/20 1025 09/13/20 1045  WBC 4.5 4.2 4.3  HGB 14.5 15.4 15.2  HCT 44.6 46.2 45.1  PLT 181 199 196   Recent Labs  Lab 09/10/20 1840 09/10/20 1843 09/11/20 0228 09/12/20 1025 09/13/20 1049  NA 141   < > 140 138 138  K 4.1   < > 3.6 4.0 4.0  CL 110   < > 109 107 106  CO2 23  --  26 26 24   BUN 14   < > 10 12 16   CREATININE 1.45*   < > 1.27* 1.18 1.43*  CALCIUM 9.1  --  8.8* 8.9 9.2  PROT 7.3  --  6.6  --   --   BILITOT 1.1  --  1.7*  --   --   ALKPHOS 90  --  79  --   --   ALT 13  --  13  --   --   AST 21  --  16  --   --   GLUCOSE 93   < > 85 104* 122*   < > = values in this interval not displayed.    Imaging/Diagnostic Tests: None last 24 hours.  Fayette Pho, MD 09/13/2020, 3:02 PM PGY-1, [facility] Family Medicine FPTS Intern pager: [phone], text pages welcome

## 2020-09-13 NOTE — Progress Notes (Signed)
   EP asked to see for Loop Recorder consideration.   Most recent PT/OT note recommending SNF and process has not started yet. I spoke to the patient and he is interested in loop recorder.   We will follow along for day of discharge and formally consult/place loop that day pending patient course.   Casimiro Needle [address]" [place], PA-C  09/13/2020 8:46 AM
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
## 2020-09-13 NOTE — Progress Notes (Signed)
  Speech Language Pathology Treatment: Dysphagia  Patient Details Name: Travis Mcconnell MRN: [id number] DOB: 05-18-1941 Today's Date: 09/13/2020 Time: 9449-6759 SLP Time Calculation (min) (ACUTE ONLY): 12 min  Assessment / Plan / Recommendation Clinical Impression  Pt seen for ongoing dysphagia management.  Pt reports some difficulty chewing, specifically with food getting stuck on side.  Pt consumed puree and regular solids today.  Pt did not drink liquid although it was provided.  Pt fed himself and exhibited much better facility with spoon and holding bowl today than on 4/16. Pt exhibited excellent oral clearance despite complaints of pocketing. There was delayed throat clear x2 across entire session.  Discussed diet preferences with pt and offered softer solids for ease of mastication.  Pt would would prefer to remain on regular diet and does appear safe with current textures.  Recommend continuing regular texture diet with thin liquids.     HPI HPI: Travis Mcconnell is a 80 y.o. 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 MRI 4/16: "Multifocal acute ischemia within the anterior left MCA territory with petechial blood products."  No chest imaging      SLP Plan  Continue with current plan of care  Patient needs continued Speech Lanaguage Pathology Services    Recommendations  Diet recommendations: Regular;Thin liquid Liquids provided via: Straw;Cup Medication Administration: Whole meds with liquid Supervision: Patient able to self feed Compensations: Slow rate;Small sips/bites Postural Changes and/or Swallow Maneuvers: Seated upright 90 degrees                Oral Care Recommendations: Oral care BID Follow up Recommendations:  (ST at next level of care) SLP Visit Diagnosis: Dysphagia, unspecified (R13.10) Plan:  Continue with current plan of care       GO                Kerrie Pleasure, MA, CCC-SLP Acute Rehabilitation Services Office: [phone] 09/13/2020, 10:28 AM

## 2020-09-13 NOTE — Consult Note (Addendum)
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 software identifies no core infarct. However, an acute left frontal lobe MCA territory infarct was present on the noncontrast head CT performed earlier today. The perfusion software identifies a 7 mL region of hypoperfusion in the region of the inferior right cerebellum and medulla. Reported mismatch 7 mL. 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 The basal inferoseptal segment is  dyskinetic. The apex is akinetic. The apical septal segment and apical inferior segment are hypokinetic. The entire anterior wall, entire lateral wall, anterior septum, mid inferoseptal segment, and mid inferior segment are normal.   12-lead ECG NSR at 79 bpm (personally reviewed) All prior EKG's in EPIC reviewed with no documented atrial fibrillation. + PVCs in couplets on second EKG 4/15  Telemetry NSR 60-80s with relatively frequent PACs and PVCs, Earlier in stay was having far more PVCs and occasional NSVT. Now on BB (personally reviewed)  Assessment and Plan:  1. Cryptogenic stroke The patient presents  with cryptogenic stroke.  The patient does not have a TEE planned for this AM.  I spoke at length with the patient about monitoring for afib with an implantable loop recorder.  Risks, benefits, and alteratives to implantable loop recorder were discussed with the patient today.   At this time, the patient is very clear in their decision to proceed with implantable loop recorder.   2. PVCs /PACs/ NSVT Burden has improved on BB per Dr. Odis Hollingshead.  Echo this admission with EF 50-55% though with regional wall abnormalities as above.   Wound care was reviewed with the patient (keep incision clean and dry for 3 days).  Wound check scheduled and entered in AVS. Please call with questions.   Graciella Freer, PA-C 09/17/2020 9:07 AM  I have seen and examined this patient with Otilio Saber.  Agree with above, note added to reflect my findings.  On exam, RRR, no murmurs.  Patient presented to the hospital with cryptogenic stroke. To date, no cause has been found. Waseem Suess plan for LINQ monitor to look for atrial fibrillation. Risks and benefits discussed. Risks include but not limited to bleeding and infection. The patient understands the risks and has agreed to the procedure.  Zayan Delvecchio M. Quinzell Malcomb MD 09/17/2020 11:07 AM

## 2020-09-13 NOTE — Progress Notes (Signed)
Paged Dr Wynelle Link, with [facility] Teaching Service, to report tele called to say had 6 beats VT and also having PVCs but that pt is asymptomatic and is resting in the bed.  No complaints or signs of distress.  No new orders or changes at this time.
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
## 2020-09-14 DIAGNOSIS — I63412 Cerebral infarction due to embolism of left middle cerebral artery: Secondary | ICD-10-CM | POA: Diagnosis not present

## 2020-09-14 MED ORDER — BENZOCAINE 10 % MT GEL
Freq: Two times a day (BID) | OROMUCOSAL | Status: DC | PRN
  Filled 2020-09-14: qty 9

## 2020-09-14 MED ORDER — METOPROLOL TARTRATE 25 MG PO TABS
25.0000 mg | ORAL_TABLET | Freq: Two times a day (BID) | ORAL | Status: DC
  Administered 2020-09-14 – 2020-09-17 (×6): 25 mg via ORAL
  Filled 2020-09-14 (×6): qty 1

## 2020-09-14 NOTE — Progress Notes (Signed)
   Patient's only concern today is gum pain.  Exan: HEENT: No facial asymmetry. Normal gum and throat exam. Neuro: Improved UL and LL strength, ++ DTR. Heart: S1 S2 normal, no murmur. RRR Resp: Intact. Ext: No edema  A/P:  Acute left MCA CVA S/P work up. Continue DAPT x 3 weeks, then switch to ASA Lipitor 40 mg PT recommended CIR. Appreciates the Neurologist's support. Loop recorder as an outpatient.  HTN: BP looks great off meds. Monitor closely.  Vtach on telemetry: Stable EKG showed: NSR 80s bpm. F/U electrolytes - potassium and magnesium. So far, no abnormality. If this becomes recurrent, resident to curbside consult with Cards. Monitor closely.

## 2020-09-14 NOTE — Plan of Care (Signed)
  Problem: Activity: Goal: Risk for activity intolerance will decrease Outcome: Progressing   Problem: Nutrition: Goal: Adequate nutrition will be maintained Outcome: Progressing   Problem: Coping: Goal: Level of anxiety will decrease Outcome: Progressing   Problem: Elimination: Goal: Will not experience complications related to bowel motility Outcome: Progressing   Problem: Pain Managment: Goal: General experience of comfort will improve Outcome: Progressing   

## 2020-09-14 NOTE — Progress Notes (Addendum)
Family Medicine Teaching Service Daily Progress Note Intern Pager: [phone]  Patient name: Travis Mcconnell Medical record number: [id number] Date of birth: 11/07/1940 Age: 80 y.o. Gender: male  Primary Care Provider: Patient, No Pcp Per (Inactive) Consultants: Neuro (s/o) Code Status: Full  Pt Overview and Major Events to Date:  4/16 admitted 4/17 Plavix started  Assessment and Plan: Camerin Ladouceur Odomis a 79 y.o.malepresenting with right sided weakness and expressive aphasia, found to have acutestroke on the anterior left MCA. PMH is significant forosteoarthritis of right knee s/p total R knee replacement.  Acute left anterior MCA stroke Right arm much improved today.  Patient using fork with right hand and feeding himself breakfast.  No complaints from patient today.  Discussed SNF versus CIR. - Consult CIR order placed to evaluate appropriateness - Continue aspirin Plavix x3 weeks - BMP, magnesium labs every 48 (next tomorrow) - Routine neurochecks - Plans per unit routine - Fall precautions  Chronic constipation Continue home miralax and docusate.    FEN/GI: Regular PPx: Lovenox   Status is: Inpatient  Remains inpatient appropriate because:awaiting dispo planning   Dispo:  Patient From: Home  Planned Disposition: Inpatient Rehab  Medically stable for discharge: No     Subjective:  Patient sitting up in bed eating breakfast with nurse at the bedside.  Patient feeding himself, using his right hand to hold a fork and feed himself.  He reports feeling better and seeing progress.  No pain, no complaints.  Discussed SNF versus CIR placement.  Objective: Temp:  [97.9 F (36.6 C)-98.4 F (36.9 C)] 98.4 F (36.9 C) (04/19 1321) Pulse Rate:  [78-86] 86 (04/19 1321) Resp:  [18-25] 25 (04/19 1321) BP: (127-156)/(96-117) 127/97 (04/19 1321) SpO2:  [96 %-100 %] 97 % (04/19 1321) Physical Exam: General: Awake, alert, no acute distress Respiratory: Unlabored respirations,  no respiratory distress Extremities: Moving all spontaneously  Laboratory: Recent Labs  Lab 09/11/20 0228 09/12/20 1025 09/13/20 1045  WBC 4.5 4.2 4.3  HGB 14.5 15.4 15.2  HCT 44.6 46.2 45.1  PLT 181 199 196   Recent Labs  Lab 09/10/20 1840 09/10/20 1843 09/11/20 0228 09/12/20 1025 09/13/20 1049  NA 141   < > 140 138 138  K 4.1   < > 3.6 4.0 4.0  CL 110   < > 109 107 106  CO2 23  --  26 26 24   BUN 14   < > 10 12 16   CREATININE 1.45*   < > 1.27* 1.18 1.43*  CALCIUM 9.1  --  8.8* 8.9 9.2  PROT 7.3  --  6.6  --   --   BILITOT 1.1  --  1.7*  --   --   ALKPHOS 90  --  79  --   --   ALT 13  --  13  --   --   AST 21  --  16  --   --   GLUCOSE 93   < > 85 104* 122*   < > = values in this interval not displayed.    Imaging/Diagnostic Tests: None last 24 hours.  , MD 09/14/2020, 3:14 PM PGY-1, [facility] Family Medicine FPTS Intern pager: [phone], text pages welcome

## 2020-09-14 NOTE — Progress Notes (Signed)
Inpatient Rehabilitation Admissions Coordinator  I spoke to patient's wife, Amil Amen, by phone and provided her cell number to his chart. They have been separated for 7 years,but not legally. He does not have 24/7 supervision available at home which is projected he will need after a short 5-7 days CIR admit. Therefore, SNF is recommended until he becomes independent to live alone or until family can arrange care at home for him. I have updated acute team and TOC. We will sign off at this time.  Ottie Glazier, RN, MSN Rehab Admissions Coordinator [phone] 09/14/2020 4:19 PM
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
## 2020-09-14 NOTE — Progress Notes (Signed)
MD notified of pain and notified need for PRN med.
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
## 2020-09-15 DIAGNOSIS — I63412 Cerebral infarction due to embolism of left middle cerebral artery: Secondary | ICD-10-CM | POA: Diagnosis not present

## 2020-09-15 DIAGNOSIS — I472 Ventricular tachycardia: Secondary | ICD-10-CM

## 2020-09-15 DIAGNOSIS — I493 Ventricular premature depolarization: Secondary | ICD-10-CM

## 2020-09-15 DIAGNOSIS — Z87891 Personal history of nicotine dependence: Secondary | ICD-10-CM

## 2020-09-15 DIAGNOSIS — I10 Essential (primary) hypertension: Secondary | ICD-10-CM

## 2020-09-15 DIAGNOSIS — I4729 Other ventricular tachycardia: Secondary | ICD-10-CM

## 2020-09-15 LAB — BASIC METABOLIC PANEL
Anion gap: 7 (ref 5–15)
BUN: 23 mg/dL (ref 8–23)
CO2: 25 mmol/L (ref 22–32)
Calcium: 9 mg/dL (ref 8.9–10.3)
Chloride: 103 mmol/L (ref 98–111)
Creatinine, Ser: 1.25 mg/dL — ABNORMAL HIGH (ref 0.61–1.24)
GFR, Estimated: 58 mL/min — ABNORMAL LOW (ref >60)
Glucose, Bld: 104 mg/dL — ABNORMAL HIGH (ref 70–99)
Potassium: 4.2 mmol/L (ref 3.5–5.1)
Sodium: 135 mmol/L (ref 135–145)

## 2020-09-15 LAB — MAGNESIUM: Magnesium: 2.1 mg/dL (ref 1.7–2.4)

## 2020-09-15 LAB — TSH: TSH: 4.992 u[IU]/mL — ABNORMAL HIGH (ref 0.350–4.500)

## 2020-09-15 LAB — SARS CORONAVIRUS 2 (TAT 6-24 HRS): SARS Coronavirus 2: NEGATIVE
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
## 2020-09-15 NOTE — Progress Notes (Signed)
PT Cancellation Note  Patient Details Name: CAYLEB JARNIGAN MRN: [id number] DOB: 1941-05-02   Cancelled Treatment:    Reason Eval/Treat Not Completed: Medical issues which prohibited therapy.  Diastolic 118, will retry as time and pt allow.     Ivar Drape 09/15/2020, 11:14 AM   Samul Dada, PT MS Acute Rehab Dept. Number: [facility] [id number] and [facility] [phone]

## 2020-09-15 NOTE — Progress Notes (Addendum)
Progress Note  Patient Name: Travis Mcconnell Date of Encounter: 09/15/2020  Attending physician: Doreene Eland, MD Primary care provider: Patient, No Pcp Per (Inactive) Consultant:Keyundra Fant Odis Hollingshead DO, [facility]  Subjective: Travis Mcconnell is a 80 y.o. male who was seen and examined at bedside at approximately 145pm No events overnight. Patient denies any chest pain or shortness of breath at rest or with effort related activities, no orthopnea, paroxysmal nocturnal dyspnea or lower extremity swelling. Just worked w/ PT  Telemetry reviewed.  Case discussed and reviewed with his nurse.  Objective: Vital Signs in the last 24 hours: Temp:  [97.7 F (36.5 C)-99.1 F (37.3 C)] 98.8 F (37.1 C) (04/20 1228) Pulse Rate:  [77-98] 79 (04/20 1228) Resp:  [18-25] 18 (04/20 1228) BP: (123-179)/(92-113) 123/94 (04/20 1228) SpO2:  [92 %-100 %] 94 % (04/20 1228)  Intake/Output:  Intake/Output Summary (Last 24 hours) at 09/15/2020 1419 Last data filed at 09/15/2020 1300 Gross per 24 hour  Intake 360 ml  Output 1125 ml  Net -765 ml    Net IO Since Admission: -669 mL [09/15/20 1419]  Telemetry: Personally reviewed- NSR w/ ventricular ectopy.   Physical examination: PHYSICAL EXAM: Vitals with BMI 09/15/2020 09/15/2020 09/15/2020  Height - - -  Weight - - -  BMI - - -  Systolic 123 158 024  Diastolic 94 92 97  Pulse 79 84 88    CONSTITUTIONAL: Appears older than stated age, hemodynamically stable, No acute distress.  SKIN: Skin is warm and dry. No rash noted. No cyanosis. No pallor. No jaundice HEAD: Normocephalic and atraumatic.  EYES: No scleral icterus MOUTH/THROAT: Moist oral membranes.  NECK: No JVD present. No thyromegaly noted. No carotid bruits  LYMPHATIC: No visible cervical adenopathy.  CHEST Normal respiratory effort. No intercostal retractions  LUNGS: Clear to auscultation bilaterally.  No stridor. No wheezes. No rales.  CARDIOVASCULAR: Regular, positive S1-S2, no murmurs rubs or  gallops appreciated. ABDOMINAL: Soft, nontender, nondistended, positive bowel sounds no apparent ascites.  EXTREMITIES: No peripheral edema  HEMATOLOGIC: No significant bruising NEUROLOGIC: Awake, alert, responds to verbal and noxious stimuli, expressive aphasia, answers most questions in yes and no fashion, follows simple commands.  PSYCHIATRIC: Normal mood and affect. Normal behavior. Cooperative  Lab Results: Hematology Recent Labs  Lab 09/11/20 0228 09/12/20 1025 09/13/20 1045  WBC 4.5 4.2 4.3  RBC 5.08 5.38 5.39  HGB 14.5 15.4 15.2  HCT 44.6 46.2 45.1  MCV 87.8 85.9 83.7  MCH 28.5 28.6 28.2  MCHC 32.5 33.3 33.7  RDW 14.3 13.9 13.8  PLT 181 199 196    Chemistry Recent Labs  Lab 09/10/20 1840 09/10/20 1843 09/11/20 0228 09/12/20 1025 09/13/20 1049 09/15/20 0055  NA 141   < > 140 138 138 135  K 4.1   < > 3.6 4.0 4.0 4.2  CL 110   < > 109 107 106 103  CO2 23  --  26 26 24 25   GLUCOSE 93   < > 85 104* 122* 104*  BUN 14   < > 10 12 16 23   CREATININE 1.45*   < > 1.27* 1.18 1.43* 1.25*  CALCIUM 9.1  --  8.8* 8.9 9.2 9.0  PROT 7.3  --  6.6  --   --   --   ALBUMIN 3.8  --  3.4*  --   --   --   AST 21  --  16  --   --   --   ALT 13  --  13  --   --   --   ALKPHOS 90  --  79  --   --   --   BILITOT 1.1  --  1.7*  --   --   --   GFRNONAA 49*  --  57* >60 50* 58*  ANIONGAP 8  --  5 5 8 7    < > = values in this interval not displayed.     Cardiac Enzymes: Cardiac Panel (last 3 results) No results for input(s): CKTOTAL, CKMB, TROPONINIHS, RELINDX in the last 72 hours.  BNP (last 3 results) No results for input(s): BNP in the last 8760 hours.  ProBNP (last 3 results) No results for input(s): PROBNP in the last 8760 hours.   DDimer No results for input(s): DDIMER in the last 168 hours.   Hemoglobin A1c:  Lab Results  Component Value Date   HGBA1C 6.1 (H) 09/12/2020   MPG 128 09/12/2020    TSH  Recent Labs    09/15/20 0055  TSH 4.992*    Lipid Panel      Component Value Date/Time   CHOL 173 09/11/2020 0228   TRIG 36 09/11/2020 0228   HDL 59 09/11/2020 0228   CHOLHDL 2.9 09/11/2020 0228   VLDL 7 09/11/2020 0228   LDLCALC 107 (H) 09/11/2020 0228   RADIOLOGY: CT head without contrast: 09/10/2020: Acute cortical/subcortical infarct within the mid left frontal lobe. ASPECTS is 9.  Chronic lacunar infarcts within the bilateral corona radiata and basal ganglia/internal capsules bilaterally.  Background mild cerebral atrophy and advanced cerebral white matter chronic small vessel ischemic disease.  Small chronic infarct within the left cerebellar hemisphere.  CTA head and neck: 09/10/2020 CTA neck: 1. Suboptimal contrast timing. 2. The common carotid and internal carotid arteries are patent within the neck without hemodynamically significant stenosis. Mild atherosclerotic disease within the carotid systems within the neck bilaterally, as described. 3. The right vertebral artery is developmentally diminutive, but patent throughout the neck. 4. The left vertebral artery is patent in the neck. Moderate stenosis at the origin of this vessel. 5. Biapical bullous and paraseptal emphysema.  CTA head: 1. Suboptimal contrast timing limits evaluation. 2. The non dominant intracranial right vertebral artery is poorly delineated beyond the right PICA origin. This may be due to developmentally small vessel size, high-grade stenosis or occlusion. 3. Elsewhere, there is no evidence of intracranial large vessel occlusion. 4. Moderate stenosis of the P1 left posterior cerebral artery just proximal to its communication with the left posterior communicating artery. 5. Mild/moderate atherosclerotic narrowing of the paraclinoid internal carotid arteries bilaterally.  CT perfusion head: The perfusion software identifies no core infarct. However, an acute left frontal lobe MCA territory infarct was present on the noncontrast head CT performed  earlier today.  The perfusion software identifies a 7 mL region of hypoperfusion in the region of the inferior right cerebellum and medulla. Reported mismatch 7 mL.  MRI brain without contrast: 1. Multifocal acute ischemia within the anterior left MCA territory with petechial blood products ( Heidelberg classification 1b: HI2, confluent petechiae, no mass effect). 2. Moderate chronic small vessel ischemic changes.  CARDIAC DATABASE: EKG: 09/10/2020: Normal sinus rhythm, 79 bpm, normal axis, IVCD without underlying injury pattern.  09/10/2020 1920: Sinus tachycardia, 101 bpm IVCD, frequent PVCs, without underlying injury pattern.  09/11/2020: Normal sinus rhythm, 83 bpm, occasional PVCs.  Echocardiogram: 09/11/2020: 1. Left ventricular ejection fraction, by estimation, is 50 to 55%. The  left ventricle has low normal function. The  left ventricle demonstrates  regional wall motion abnormalities (see scoring diagram/findings for  description). The left ventricular  internal cavity size was moderately dilated. Left ventricular diastolic  parameters are consistent with Grade I diastolic dysfunction (impaired  relaxation).  2. Right ventricular systolic function is normal. The right ventricular  size is normal.  3. The mitral valve is normal in structure. Mild mitral valve regurgitation.  4. The aortic valve is normal in structure. Aortic valve regurgitation is not visualized.   Stress Testing:  None   Heart Catheterization: None   Scheduled Meds: . aspirin EC  81 mg Oral Daily  . atorvastatin  40 mg Oral Daily  . clopidogrel  75 mg Oral Daily  . docusate sodium  100 mg Oral BID  . enoxaparin (LOVENOX) injection  40 mg Subcutaneous QHS  . feeding supplement  237 mL Oral BID BM  . metoprolol tartrate  25 mg Oral BID  . multivitamin with minerals  1 tablet Oral Daily  . polyethylene glycol  17 g Oral BID    Continuous Infusions:   PRN Meds: benzocaine   IMPRESSION &  RECOMMENDATIONS: Travis Mcconnell is a 80 y.o. male whose past medical history and cardiac risk factors include: Osteoarthritis, former smoker.  Impression: Nonsustained ventricular tachycardia: resolved. PVCs, frequent: improved  Precordial pain-non cardiac: resolved.  Acute left MCA ischemic embolic stroke, embolic pattern concerning for cardioembolic etiology Chronic lacunar infarcts within the bilateral corona radiata and basal ganglia/internal capsules bilaterally Benign essential hypertension. Hyperlipidemia. Former smoker  Plan:  Cardiology was consulted for asymptomatic NSVT management noted on telemetry. Echo reported low normal LVEF w/ RWMA; however, I independently reviewed his study and his LVEF is mildly reduced w/ RWMA.   Given the mildly reduced LVEF with regional wall motion abnormalities and a degree of precordial pain (history limited) recommended ischemic evaluation and/or initiating medical management.  I discussed that we could consider a stress test prior to discharge but patient states that he would like to hold off on additional testing at this time and prefers medical management. Not an ideal candidate for angiography due to the acute stroke as he is at risk of hemorraghic conversion unless change in clinical status or develops ACS. He verbalizes understanding.   Started on Lopressor 25mg  po bid, has received two doses and his BP has improved and no recurrence of NSVT on telemetry. Tele personally reviewed and also called Central Tele unit and discussed if there were any other concerning findings/alarms.   He continues to have PVC but the burden has improved.   Will defer BP management to primary and neurology given recent stroke. But if additional medication is consider may increase Lopressor.   Outpatient follow scheduled for 10/20/2020 at 11am at [facility].   Recommendation provided to primary team ([facility]. [facility]) and d/w nursing staff [facility] during rounds  as well.   Patient's questions and concerns were addressed to his satisfaction. He voices understanding of the instructions provided during this encounter.   This note was created using a voice recognition software as a result there may be grammatical errors inadvertently enclosed that do not reflect the nature of this encounter. Every attempt is made to correct such errors.  [facility], DO, [facility] Office: [phone] 09/15/2020, 2:19 PM

## 2020-09-15 NOTE — Plan of Care (Signed)
  Problem: Education: Goal: Knowledge of disease or condition will improve Outcome: Progressing Goal: Knowledge of secondary prevention will improve Outcome: Progressing Goal: Knowledge of patient specific risk factors addressed and post discharge goals established will improve Outcome: Progressing   Problem: Education: Goal: Knowledge of General Education information will improve Description: Including pain rating scale, medication(s)/side effects and non-pharmacologic comfort measures Outcome: Progressing   Problem: Health Behavior/Discharge Planning: Goal: Ability to manage health-related needs will improve Outcome: Progressing   Problem: Clinical Measurements: Goal: Ability to maintain clinical measurements within normal limits will improve Outcome: Progressing Goal: Will remain free from infection Outcome: Progressing Goal: Diagnostic test results will improve Outcome: Progressing Goal: Respiratory complications will improve Outcome: Progressing Goal: Cardiovascular complication will be avoided Outcome: Progressing   Problem: Activity: Goal: Risk for activity intolerance will decrease Outcome: Progressing

## 2020-09-15 NOTE — TOC Initial Note (Signed)
Transition of Care [facility]) - Initial/Assessment Note    Patient Details  Name: Travis Mcconnell MRN: [id number] Date of Birth: 1940/07/05  Transition of Care [facility]) CM/SW Contact:    Chana Bode, Student-Social Work Phone Number: 09/15/2020, 3:19 PM  Clinical Narrative:    MSW Student spoke with patient's wife Amil Amen about SNF being the next option since he will not have 24/7 supervision at home. Amil Amen is in agreement and student gave her bed options. Later Amil Amen chose [facility] as her preferred facility. Waiting to hear back from [facility] about whether they will have a bed available tomorrow.               Expected Discharge Plan: Skilled Nursing Facility Barriers to Discharge: Continued Medical Work up,Insurance Authorization   Patient Goals and CMS Choice Patient states their goals for this hospitalization and ongoing recovery are:: Unable to assess patient disoriented CMS Medicare.gov Compare Post Acute Care list provided to:: Patient Represenative (must comment) Choice offered to / list presented to : Spouse  Expected Discharge Plan and Services Expected Discharge Plan: Skilled Nursing Facility       Living arrangements for the past 2 months: Single Family Home                                      Prior Living Arrangements/Services Living arrangements for the past 2 months: Single Family Home Lives with:: Self Patient language and need for interpreter reviewed:: No Do you feel safe going back to the place where you live?: Yes      Need for Family Participation in Patient Care: Yes (Comment) Care giver support system in place?: No (comment)   Criminal Activity/Legal Involvement Pertinent to Current Situation/Hospitalization: No - Comment as needed  Activities of Daily Living Home Assistive Devices/Equipment: None ADL Screening (condition at time of admission) Patient's cognitive ability adequate to safely complete daily activities?: Yes Is the patient deaf or have  difficulty hearing?: No Does the patient have difficulty seeing, even when wearing glasses/contacts?: No Does the patient have difficulty concentrating, remembering, or making decisions?: Yes Patient able to express need for assistance with ADLs?: Yes Does the patient have difficulty dressing or bathing?: No Independently performs ADLs?: Yes (appropriate for developmental age) Does the patient have difficulty walking or climbing stairs?: Yes Weakness of Legs: Both Weakness of Arms/Hands: Right  Permission Sought/Granted Permission sought to share information with : Facility Contractor granted to share information with : Yes, Verbal Permission Granted  Share Information with NAME: Amil Amen  Permission granted to share info w AGENCY: SNF  Permission granted to share info w Relationship: Spouse     Emotional Assessment Appearance:: Appears stated age Attitude/Demeanor/Rapport: Unable to Assess Affect (typically observed): Unable to Assess Orientation: : Oriented to Self,Oriented to Place Alcohol / Substance Use: Not Applicable Psych Involvement: No (comment)  Admission diagnosis:  Stroke [facility]) [I63.9] Patient Active Problem List   Diagnosis Date Noted  . NSVT (nonsustained ventricular tachycardia) (HCC)   . PVC (premature ventricular contraction)   . Benign hypertension   . Former smoker   . Stroke (HCC) 09/10/2020  . Osteoarthritis of right knee 12/11/2019  . 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 [place] [address] Phone: [phone] Fax: [phone]     Social Determinants of Health (SDOH) Interventions     Readmission Risk Interventions No flowsheet data found.

## 2020-09-15 NOTE — Progress Notes (Signed)
Pt w/ BP 173/113 w/o coverage. Travis Mcconnell ([facility]) informed.

## 2020-09-15 NOTE — Progress Notes (Signed)
Family Medicine Teaching Service Daily Progress Note Intern Pager: [phone]  Patient name: Travis Mcconnell Medical record number: [id number] Date of birth: 12-Mar-1941 Age: 80 y.o. Gender: male  Primary Care Provider: Patient, No Pcp Per (Inactive) Consultants: Stroke team (s/o) Code Status: Full  Pt Overview and Major Events to Date:  4/16 admitted 4/17 plavix started  Assessment and Plan: Travis Mcconnell a 79 y.o.malepresenting with right sided weakness and expressive aphasia, found to have acutestrokeon the anterior left MCA. PMH is significant forosteoarthritis of right knee s/p total R knee replacement.  Acute left anterior MCA stroke When the physician walked into his room, Travis Mcconnell was using the urinal in bed. Of note, he was holding the urinal with his right hand without difficulty. We stepped away to give him privacy and saw other patients. Will return this afternoon and addend note with physical exam.  - CIR not an option for this patient, will pursue SNF - continue asa, plavix - BMP, mag q48 hours (next 4/22) - routine neuro checks - vitals per unit routine - fall precautions - PT/OT/SLP  Hypertension  Overnight VT runs Hypertensive overnight with systolics 130s-170s, diastolics 90s-100s. Cardiology started metoprolol 25 mg BID yesterday.  - cardiology consulted, appreciate recommendations - continue metoprolol 25 BID - monitor  Chronic constipation Continue home miralax and docusate.   FEN/GI: regular PPx: lovenox   Status is: Inpatient  Remains inpatient appropriate because:awaiting SNF placement   Dispo:  Patient From: Home  Planned Disposition: Inpatient Rehab  Medically stable for discharge: No     Subjective:  When the physician walked into his room, Travis Mcconnell was using the urinal in bed. Of note, he was holding the urinal with his right hand without difficulty. We stepped away to give him privacy and saw other patients. Will return this  afternoon and addend note with physical exam.   Objective: Temp:  [97.7 F (36.5 C)-99.1 F (37.3 C)] 98.1 F (36.7 C) (04/20 0730) Pulse Rate:  [77-98] 87 (04/20 0730) Resp:  [20-25] 20 (04/20 0730) BP: (127-179)/(92-113) 155/97 (04/20 0730) SpO2:  [92 %-100 %] 100 % (04/20 0730) Physical Exam:  Will addend note after seeing him again this afternoon for physical exam.   Laboratory: Recent Labs  Lab 09/11/20 0228 09/12/20 1025 09/13/20 1045  WBC 4.5 4.2 4.3  HGB 14.5 15.4 15.2  HCT 44.6 46.2 45.1  PLT 181 199 196   Recent Labs  Lab 09/10/20 1840 09/10/20 1843 09/11/20 0228 09/12/20 1025 09/13/20 1049 09/15/20 0055  NA 141   < > 140 138 138 135  K 4.1   < > 3.6 4.0 4.0 4.2  CL 110   < > 109 107 106 103  CO2 23  --  26 26 24 25   BUN 14   < > 10 12 16 23   CREATININE 1.45*   < > 1.27* 1.18 1.43* 1.25*  CALCIUM 9.1  --  8.8* 8.9 9.2 9.0  PROT 7.3  --  6.6  --   --   --   BILITOT 1.1  --  1.7*  --   --   --   ALKPHOS 90  --  79  --   --   --   ALT 13  --  13  --   --   --   AST 21  --  16  --   --   --   GLUCOSE 93   < > 85 104* 122* 104*   < > =  values in this interval not displayed.    Imaging/Diagnostic Tests: None last 24 hours.   Travis Pho, MD 09/15/2020, 8:05 AM PGY-1, [facility] Family Medicine FPTS Intern pager: [phone], text pages welcome

## 2020-09-15 NOTE — Progress Notes (Signed)
Per MD rep w/ [facility], no coverage for HTN at this time.
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
## 2020-09-16 DIAGNOSIS — I472 Ventricular tachycardia: Secondary | ICD-10-CM

## 2020-09-16 DIAGNOSIS — I10 Essential (primary) hypertension: Secondary | ICD-10-CM | POA: Diagnosis not present

## 2020-09-16 DIAGNOSIS — I63412 Cerebral infarction due to embolism of left middle cerebral artery: Secondary | ICD-10-CM | POA: Diagnosis not present

## 2020-09-16 MED ORDER — METOPROLOL TARTRATE 25 MG PO TABS: 25.0000 mg | ORAL_TABLET | Freq: Two times a day (BID) | ORAL | Status: AC

## 2020-09-16 MED ORDER — ACETAMINOPHEN 325 MG PO TABS: 650.0000 mg | ORAL_TABLET | Freq: Four times a day (QID) | ORAL | Status: AC | PRN

## 2020-09-16 MED ORDER — CLOPIDOGREL BISULFATE 75 MG PO TABS: 75.0000 mg | ORAL_TABLET | Freq: Every day | ORAL | 0 refills | Status: AC

## 2020-09-16 MED ORDER — ADULT MULTIVITAMIN W/MINERALS CH: 1.0000 | ORAL_TABLET | Freq: Every day | ORAL | Status: AC

## 2020-09-16 MED ORDER — ACETAMINOPHEN 325 MG PO TABS
650.0000 mg | ORAL_TABLET | Freq: Four times a day (QID) | ORAL | Status: DC | PRN
  Administered 2020-09-16: 650 mg via ORAL
  Filled 2020-09-16: qty 2

## 2020-09-16 MED ORDER — ASPIRIN 81 MG PO TBEC: 81.0000 mg | DELAYED_RELEASE_TABLET | Freq: Four times a day (QID) | ORAL | 11 refills | Status: DC | PRN

## 2020-09-16 MED ORDER — BENZOCAINE 10 % MT GEL: Freq: Two times a day (BID) | OROMUCOSAL | 0 refills | Status: AC | PRN

## 2020-09-16 MED ORDER — ENSURE ENLIVE PO LIQD: 237.0000 mL | Freq: Two times a day (BID) | ORAL | 12 refills | Status: AC

## 2020-09-16 MED ORDER — ATORVASTATIN CALCIUM 40 MG PO TABS: 40.0000 mg | ORAL_TABLET | Freq: Every day | ORAL | Status: AC
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
## 2020-09-16 NOTE — Progress Notes (Signed)
  Speech Language Pathology Treatment: Cognitive-Linquistic  Patient Details Name: Travis Mcconnell MRN: [id number] DOB: Mar 31, 1941 Today's Date: 09/16/2020 Time: 0902-0928 SLP Time Calculation (min) (ACUTE ONLY): 26 min  Assessment / Plan / Recommendation Clinical Impression  Pt seen for skilled treatment session focusing on aphasia with fillers noted "I ummm.." oftentimes during unstructured conversation or when answering a question re: personal information.  Compensatory strategies discussed such as circumlocution, descriptions, using familiar phrases when answering questions and min-mod verbal/visual cues using categorization or sentence completion during structured naming tasks with 60% accuracy obtained without cues; with cues, he achieved 80% accuracy.  He was able to complete requests such as "I need to go to the bathroom" during session without assistance, but this was inconsistent.  Reading assessed with use of simple environmental signs with good accuracy obtained with words-phrases yielding 90% accuracy and min phonemic cues provided occasionally during reading tasks.  Intelligibility overall judged to be 75% accurate with words-simple sentences due primarily to decreased intensity.  Dominant hand affected by CVA, so writing not assessed per pt request.  ST will continue to f/u during acute stay for aphasia tx and ongoing assessment of cognition/graphic expression.    HPI HPI: Travis Mcconnell is a 80 y.o. 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 MRI 4/16: "Multifocal acute ischemia within the anterior left MCA territory with petechial blood products."  No chest imaging      SLP Plan  Continue with current plan of care       Recommendations  Diet recommendations: Regular;Thin liquid Liquids provided via:  Straw;Cup Medication Administration: Whole meds with puree Supervision: Patient able to self feed;Intermittent supervision to cue for compensatory strategies Compensations: Minimize environmental distractions;Small sips/bites                Follow up Recommendations: Other (comment) (TBD) SLP Visit Diagnosis: Aphasia (R47.01);Cognitive communication deficit (X83.382) Plan: Continue with current plan of care                       Travis Mcconnell, M.S., CCC-SLP 09/16/2020, 11:55 AM

## 2020-09-16 NOTE — TOC Progression Note (Signed)
Transition of Care [facility]) - Progression Note    Patient Details  Name: Travis Mcconnell MRN: [id number] Date of Birth: [birth year]  [facility] CM/SW Contact  Baldemar Lenis, [place] Phone Number:  09/16/2020, 3:43 PM  Clinical Narrative:   CSW spoke with patient's spouse, Amil Amen, about SNF choices, as [facility] and [facility] are both unavailable. Wife reviewed choices and asked for [facility], who is able to accept the patient but the patient has a liability claim from Oct 18, 2015 blocking his Medicare. CSW asked patient but he did not know anything about it. CSW called [employer] to inquire and received confirmation that the claim was closed, sent over to [facility]. Due to insurance barrier, loop was not placed today as unsure of DC today; loop will be placed tomorrow and patient can discharge to SNF. CSW to follow.    Expected Discharge Plan: Skilled Nursing Facility Barriers to Discharge: Continued Medical Work up,Insurance Authorization  Expected Discharge Plan and Services Expected Discharge Plan: Skilled Nursing Facility       Living arrangements for the past 2 months: Single Family Home                                       Social Determinants of Health (SDOH) Interventions    Readmission Risk Interventions No flowsheet data found.

## 2020-09-16 NOTE — Progress Notes (Signed)
Family Medicine Teaching Service Daily Progress Note Intern Pager: [phone]  Patient name: Travis Mcconnell Medical record number: [id number] Date of birth: [birth year] Age: 80 y.o. Gender: male  Primary Care Provider: Patient, No Pcp Per (Inactive) Consultants: Neuro (s/o) Code Status: Full  Pt Overview and Major Events to Date:  4/16 Admitted, asa started 4/17 plavix started  Assessment and Plan: Travis Mcconnell a 79 y.o.malepresenting with right sided weakness and expressive aphasia, found to have acutestrokeof the anterior left MCA. PMH is significant forosteoarthritis of right knee s/p total R knee replacement.  Acute left anterior MCA stroke Improved strength and control of right arm and hand. Remains dysarthric but much improved from admission. When asked about his progress, patient says "some days are good, but some days are not good".  - plan for discharge to SNF - continue asa, plavix x3 weeks - BMP, mag q48 hours (next 4/22) - routine neuro checks - vitals per unit routine - fall precautions - PT/OT/SLP  Hypertension  Overnight VT runs Some hypertensive measurements this morning, normotensive overnight.  - awaiting orthostatic vitals - cardiology consulted, appreciate recommendations - continue metoprolol 25 BID - monitor  Chronic constipation Continue home miralax and docusate.   FEN/GI: regular PPx: lovenox   Status is: Inpatient  Remains inpatient appropriate because:awaiting SNF placement   Dispo:  Patient From: Home  Planned Disposition: Skilled Nursing Facility  Medically stable for discharge: No     Subjective:  Improved strength and control of right arm and hand. Remains dysarthric but much improved from admission. When asked about his progress, patient says "some days are good, but some days are not good".   Objective: Temp:  [98 F (36.7 C)-98.9 F (37.2 C)] 98 F (36.7 C) (04/21 1203) Pulse Rate:  [66-93] 66 (04/21 1203) Resp:   [16-20] 20 (04/21 1203) BP: (119-154)/(80-100) 154/93 (04/21 1203) SpO2:  [94 %-98 %] 98 % (04/21 1203) Physical Exam: General: awake, alert, NAD Respiratory: unlabored respirations, no respiratory distress Extremities: BUE strength 5/5 and equal, grip strength 5/5 and equal  Laboratory: Recent Labs  Lab 09/11/20 0228 09/12/20 1025 09/13/20 1045  WBC 4.5 4.2 4.3  HGB 14.5 15.4 15.2  HCT 44.6 46.2 45.1  PLT 181 199 196   Recent Labs  Lab 09/10/20 1840 09/10/20 1843 09/11/20 0228 09/12/20 1025 09/13/20 1049 09/15/20 0055  NA 141   < > 140 138 138 135  K 4.1   < > 3.6 4.0 4.0 4.2  CL 110   < > 109 107 106 103  CO2 23  --  26 26 24 25   BUN 14   < > 10 12 16 23   CREATININE 1.45*   < > 1.27* 1.18 1.43* 1.25*  CALCIUM 9.1  --  8.8* 8.9 9.2 9.0  PROT 7.3  --  6.6  --   --   --   BILITOT 1.1  --  1.7*  --   --   --   ALKPHOS 90  --  79  --   --   --   ALT 13  --  13  --   --   --   AST 21  --  16  --   --   --   GLUCOSE 93   < > 85 104* 122* 104*   < > = values in this interval not displayed.    Imaging/Diagnostic Tests: None last 24 hours.   , MD 09/16/2020, 12:10 PM PGY-1, [facility]  [place] Intern pager: [phone], text pages welcome
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
## 2020-09-16 NOTE — Discharge Summary (Signed)
Family Medicine Teaching [facility] Discharge Summary  Patient name: Travis Mcconnell Medical record number: [id number] Date of birth: 1940-11-30 Age: 80 y.o. 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 rate 20, height  (1.803 m), weight 87.1 kg, SpO2 98 %.  Physical Exam: General:  Awake, alert, no acute distress Respiratory:  Unlabored respirations, no respiratory distress Extremities: Grip strength 5/5 and equal, bilateral upper extremity strength 5/5 and equal  Brief Hospital Course:  AKI [name] is a 80 y.o. male presenting with right sided weakness and expressive aphasia, found to have acute stroke in the anterior left MCA. 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   Patient began experiencing right sided weakness and expressive aphasia shortly after lunch on 4/15. Drove himself to the hospital. In the ED, pt was hypertensive but otherwise stable on room air. CBC unremarkable. CMP notable for creatinine 1.40 (baseline per chart review appears to be 1.2-1.4) and ionized calcium 1.09. Glucose 90, UDS negative, ETOH <10.  Respiratory labs negative for COVID and Flu A/B. CT head wo contrast demonstrated acute cortical/subcortical infarct within mid left frontal lobe, along with other chronic infarcts, chronic small vessel ischemic disease, and mild cerebral atrophy. CTA head indicates moderate stenosis of P1 left posterior cerebral artery and mild to moderate atherosclerotic narrowing or  paraclinoid internal carotid arteries bilaterally. CTA neck shows patent carotid arteries and biapical bullous and paraseptal emphysema. CT head perfusion study shows hypoperfusion in inferior right cerebellum and medulla, measured at mismatch of 7 mL. Neurology consulted, recommended starting ASA. MR brain demonstrated multifocal acute ischemia within the anterior left MCA territory with petechial blood products and moderate chronic small vessel ischemic changes. Neurology evaluated and recommended continuing aspirin. Recommended asa and plavix for 3 weeks followed by aspirin only. Did experience intermittent runs of V. Tach overnight; cardiology and EP consulted, started metoprolol and inplanted loop recorder prior to discharge. Patient made great strides in right arm/hand strength, grip, and use along with dysarthria while admitted and working with inpatient PT/OT/SLP. Hopeful for good recovery. Patient to follow up with neuro in 4 weeks.    Issues for Follow Up:  1. Continue asa and plavix DAPT daily for 3 weeks (4/17 - 5/8); afterwards, continue aspirin only.  2. LDL 107. Given acute stroke, started on lipitor 40 mg while inpatient. Monitor for tolerance. Recheck lipid panel in 3 months.  3. Recommend for outpatient speech therapy follow up.  4. TSH elevated at 4.9 during admission. Recommend recheck with PCP as outpatient in 4-6 weeks and medication as necessary.  5. Cardiology recommended outpatient sleep study to rule out sleep apnea as possible cause of nonsustained runs of V. Tach overnight. 6. Cardiology started metoprolol while admitted, no runs of V. Tach overnight after starting this. Monitor for tolerance. Recheck orthostatic vitals at PCP follow up.  7. Neurology follow up in about 4 weeks with stroke NP at [facility].   Significant Procedures: None  Significant Labs and Imaging:  Recent Labs  Lab 09/11/20 0228 09/12/20 1025 09/13/20 1045  WBC 4.5 4.2 4.3  HGB  14.5 15.4 15.2  HCT 44.6  46.2 45.1  PLT 181 199 196   Recent Labs  Lab 09/10/20 1840 09/10/20 1843 09/11/20 0228 09/12/20 1025 09/13/20 1045 09/13/20 1049 09/15/20 0055 09/17/20 0415  NA 141   < > 140 138  --  138 135 136  K 4.1   < > 3.6 4.0  --  4.0 4.2 4.6  CL 110   < > 109 107  --  106 103 103  CO2 23  --  26 26  --  24 25 25   GLUCOSE 93   < > 85 104*  --  122* 104* 96  BUN 14   < > 10 12  --  16 23 22   CREATININE 1.45*   < > 1.27* 1.18  --  1.43* 1.25* 1.29*  CALCIUM 9.1  --  8.8* 8.9  --  9.2 9.0 8.9  MG  --   --   --   --  1.9  --  2.1 2.2  ALKPHOS 90  --  79  --   --   --   --   --   AST 21  --  16  --   --   --   --   --   ALT 13  --  13  --   --   --   --   --   ALBUMIN 3.8  --  3.4*  --   --   --   --   --    < > = values in this interval not displayed.    CT HEAD WITHOUT CONTRAST 09/10/20  TECHNIQUE: Contiguous axial images were obtained from the base of the skull through the vertex without intravenous contrast.  COMPARISON:  No pertinent prior exams available for comparison.  FINDINGS: Brain:  Mild cerebral atrophy.  Abnormal cortical/subcortical hypodensity within the mid left frontal lobe compatible with acute infarction (for instance as seen on series 2, image 25).  Chronic lacunar infarcts within the bilateral corona radiata and basal ganglia/internal capsules.  Background advanced ill-defined hypoattenuation within the cerebral white matter is nonspecific, but compatible with chronic small vessel ischemic disease.  Small chronic infarct within the left cerebellar hemisphere.  There is no acute intracranial hemorrhage.  No extra-axial fluid collection.  No evidence of intracranial mass.  No midline shift.  Vascular: No hyperdense vessel.  Atherosclerotic calcifications.  Skull: Normal. Negative for fracture or focal lesion.  Sinuses/Orbits: Visualized orbits show no acute finding. No significant paranasal sinus disease at the imaged  levels.  ASPECTS [facility]([place] Stroke Program Early CT Score)  - Ganglionic level infarction (caudate, lentiform nuclei, internal capsule, insula, M1-M3 cortex): 7  - Supraganglionic infarction (M4-M6 cortex): 2  Total score (0-10 with 10 being normal): 9  These results were communicated to Dr. Amada JupiterKirkpatrick at 7:04 pmon 4/15/2022by text page via the [facility] messaging system.  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 ASPECTS is 9.  Chronic lacunar infarcts within the bilateral corona radiata and basal ganglia/internal capsules bilaterally.  Background mild cerebral atrophy and advanced cerebral white matter chronic small vessel ischemic disease.  Small chronic infarct within the left cerebellar hemisphere.  CT HEAD WITHOUT CONTRAST 09/10/20 CT ANGIOGRAPHY HEAD AND NECK 09/10/20 CT PERFUSION BRAIN 09/10/20  TECHNIQUE: Multidetector CT imaging of the head and neck was performed using the standard protocol during bolus administration of intravenous contrast. Multiplanar CT image reconstructions and MIPs were obtained to evaluate the vascular anatomy. Carotid  stenosis measurements (when applicable) are obtained utilizing NASCET criteria, using the distal internal carotid diameter as the denominator.  Multiphase CT imaging of the brain was performed following IV bolus contrast injection. Subsequent parametric perfusion maps were calculated using RAPID software.  CONTRAST:  OMNIPAQUE IOHEXOL 350 MG/ML SOLN  COMPARISON:  Noncontrast head CT performed earlier today 09/10/2020.  FINDINGS: CTA NECK FINDINGS  Suboptimal contrast timing limits evaluation.  Aortic arch: Common origin of the innominate and left common carotid arteries. The visualized aortic arch is normal in caliber. Atherosclerotic plaque within the visualized aortic arch and proximal major branch vessels of the neck. No hemodynamically significant  innominate or proximal subclavian artery stenosis.  Right carotid system: CCA and ICA patent within the neck without stenosis. Mild calcified plaque within the carotid bifurcation.  Left carotid system: CCA and ICA patent within the neck without stenosis. Mild soft and calcified plaque within the carotid bifurcation and proximal ICA.  Vertebral arteries: The right vertebral artery is developmentally diminutive throughout the neck, but patent. The left vertebral artery is patent. Moderate stenosis at the origin of the left vertebral artery.  Skeleton: No acute bony abnormality or aggressive osseous lesion. Cervical spondylosis with multilevel disc space narrowing, disc bulges, disc protrusions, posterior disc osteophytes, uncovertebral hypertrophy and facet arthrosis.  Other neck: No neck mass or cervical lymphadenopathy.  Upper chest: No consolidation within the imaged lung apices. Biapical bullous and paraseptal emphysema (more prominent on the right).  Review of the MIP images confirms the above findings  CTA HEAD FINDINGS  Suboptimal contrast bolus timing somewhat limits evaluation.  Anterior circulation:  The intracranial internal carotid arteries are patent. Calcified plaque within both vessels. Mild to moderate stenosis of the paraclinoid segments bilaterally. The M1 middle cerebral arteries are patent. The limitations described, no M2 proximal branch occlusion or high-grade proximal stenosis is identified. The A1 right anterior cerebral artery is developmentally absent. The anterior cerebral arteries are otherwise patent. Atherosclerotic irregularity of both vessels without appreciable high-grade proximal stenosis. No intracranial aneurysm is identified.  Posterior circulation:  The non dominant intracranial right vertebral artery is poorly delineated beyond the right PICA origin which may be secondary to developmentally small vessel size, high-grade  stenosis or occlusion. The dominant intracranial left vertebral artery is patent without stenosis. The basilar artery is patent. The posterior cerebral arteries are patent. Moderate stenosis of the P1 left posterior cerebral artery just proximal to the communication with the left posterior communicating artery. Posterior communicating arteries are present bilaterally.  Venous sinuses: Within the limitations of contrast timing, no convincing thrombus.  Anatomic variants: None significant  Review of the MIP images confirms the above findings  CT Brain Perfusion Findings:  CBF (<30%) Volume: 50mL  Perfusion (Tmax>6.0s) volume: 60mL (in the region of the inferior right cerebellum and medulla)  Mismatch Volume: 18mL  Infarction Location:None identified  IMPRESSION: CTA neck:  1. Suboptimal contrast timing. 2. The common carotid and internal carotid arteries are patent within the neck without hemodynamically significant stenosis. Mild atherosclerotic disease within the carotid systems within the neck bilaterally, as described. 3. The right vertebral artery is developmentally diminutive, but patent throughout the neck. 4. The left vertebral artery is patent in the neck. Moderate stenosis at the origin of this vessel. 5. Biapical bullous and paraseptal emphysema.  CTA head:  1. Suboptimal contrast timing limits evaluation. 2. The non dominant intracranial right vertebral artery is poorly delineated beyond the right PICA origin. This may be due to developmentally small vessel  size, high-grade stenosis or occlusion. 3. Elsewhere, there is no evidence of intracranial large vessel occlusion. 4. Moderate stenosis of the P1 left posterior cerebral artery just proximal to its communication with the left posterior communicating artery. 5. Mild/moderate atherosclerotic narrowing of the paraclinoid internal carotid arteries bilaterally.  CT perfusion head:  The  perfusion software identifies no core infarct. However, an acute left frontal lobe MCA territory infarct was present on the noncontrast head CT performed earlier today.  The perfusion software identifies a 7 mL region of hypoperfusion in the region of the inferior right cerebellum and medulla. Reported mismatch 7 mL.  MRI HEAD WITHOUT CONTRAST  TECHNIQUE: Multiplanar, multiecho pulse sequences of the brain and surrounding structures were obtained without intravenous contrast.  COMPARISON:  None.  FINDINGS: Brain: There is multifocal acute ischemia within the anterior left MCA territory. Petechial blood products at the infarct site. Hyperintense T2-weighted signal is moderately widespread throughout the white matter. Generalized volume loss without a clear lobar predilection. Old left cerebellar infarct. The midline structures are normal.  Vascular: Major flow voids are preserved.  Skull and upper cervical spine: Normal calvarium and skull base. Visualized upper cervical spine and soft tissues are normal.  Sinuses/Orbits:No paranasal sinus fluid levels or advanced mucosal thickening. No mastoid or middle ear effusion. Normal orbits.  IMPRESSION: 1. Multifocal acute ischemia within the anterior left MCA territory with petechial blood products ( Heidelberg classification 1b: HI2, confluent petechiae, no mass effect). 2. Moderate chronic small vessel ischemic changes.  Results/Tests Pending at Time of Discharge: None  Discharge Medications:  Allergies as of 09/17/2020      Reactions   Dewitts Pain Reliever [magnesium Salicylate]    Other reaction(s): Unknown   Other    No Blood Products   Tramadol Other (See Comments)   Per the [facility]- (delirium)      Medication List    TAKE these medications   acetaminophen 325 MG tablet Commonly known as: TYLENOL Take 2 tablets (650 mg total) by mouth every 6 (six) hours as needed for mild pain.   aspirin 81 MG EC  tablet Take 1 tablet (81 mg total) by mouth every 6 (six) hours as needed. Swallow whole.   atorvastatin 40 MG tablet Commonly known as: LIPITOR Take 1 tablet (40 mg total) by mouth daily.   benzocaine 10 % mucosal gel Commonly known as: ORAJEL Use as directed in the mouth or throat 2 (two) times daily as needed for mouth pain.   clopidogrel 75 MG tablet Commonly known as: PLAVIX Take 1 tablet (75 mg total) by mouth daily for 21 days.   docusate sodium 100 MG capsule Commonly known as: Colace Take 1 capsule (100 mg total) by mouth 2 (two) times daily.   feeding supplement Liqd Take 237 mLs by mouth 2 (two) times daily between meals.   ferrous sulfate 325 (65 FE) MG EC tablet Take 325 mg by mouth daily with breakfast. What changed: Another medication with the same name was removed. Continue taking this medication, and follow the directions you see here.   HYDROcodone-acetaminophen 5-325 MG tablet Commonly known as: Norco Take 1-2 tablets by mouth every 4 (four) hours as needed for moderate pain or severe pain.   methocarbamol 500 MG tablet Commonly known as: Robaxin Take 1 tablet (500 mg total) by mouth every 6 (six) hours as needed for muscle spasms.   metoprolol tartrate 25 MG tablet Commonly known as: LOPRESSOR Take 1 tablet (25 mg total) by mouth 2 (two) times  daily.   multivitamin with minerals Tabs tablet Take 1 tablet by mouth daily.   polyethylene glycol 17 g packet Commonly known as: MIRALAX / GLYCOLAX Take 17 g by mouth 2 (two) times daily.       Discharge Instructions: Please refer to Patient Instructions section of EMR for full details.  Patient was counseled important signs and symptoms that should prompt return to medical care, changes in medications, dietary instructions, activity restrictions, and follow up appointments.   Follow-Up Appointments:  Follow-up Information    Marvel Plan, MD. Schedule an appointment as soon as possible for a visit in 4  week(s).   Specialty: Neurology Why: You will need to see the stroke team nurse practitioner at the [facility] in 4 weeks after discharge from the hospital.  Contact information: [address] [place] [address] [phone]        Elder Negus, MD Follow up on 10/20/2020.   Specialties: Cardiology, Radiology Why: 11am Contact information: [address] [place] [address] [phone]        [facility] Follow up.   Why: 5/5 at 320 pm for post loop recorder wound check Contact information: [address] [place] [address] [phone]              Fayette Pho, MD 09/17/2020, 3:01 PM PGY-1, [facility] Family Medicine

## 2020-09-17 ENCOUNTER — Encounter (HOSPITAL_COMMUNITY)
Admission: EM | Disposition: A | Discharge status: Skilled Nursing Facility | Payer: Self-pay | Source: Home / Self Care | Attending: Family Medicine

## 2020-09-17 ENCOUNTER — Encounter (HOSPITAL_COMMUNITY): Payer: Self-pay | Admitting: Cardiology

## 2020-09-17 DIAGNOSIS — I10 Essential (primary) hypertension: Secondary | ICD-10-CM | POA: Diagnosis not present

## 2020-09-17 DIAGNOSIS — I63412 Cerebral infarction due to embolism of left middle cerebral artery: Secondary | ICD-10-CM | POA: Diagnosis not present

## 2020-09-17 DIAGNOSIS — I472 Ventricular tachycardia: Secondary | ICD-10-CM | POA: Diagnosis not present

## 2020-09-17 DIAGNOSIS — I493 Ventricular premature depolarization: Secondary | ICD-10-CM | POA: Diagnosis not present

## 2020-09-17 DIAGNOSIS — I6389 Other cerebral infarction: Secondary | ICD-10-CM

## 2020-09-17 HISTORY — PX: LOOP RECORDER INSERTION: EP1214

## 2020-09-17 LAB — BASIC METABOLIC PANEL
Anion gap: 8 (ref 5–15)
BUN: 22 mg/dL (ref 8–23)
CO2: 25 mmol/L (ref 22–32)
Calcium: 8.9 mg/dL (ref 8.9–10.3)
Chloride: 103 mmol/L (ref 98–111)
Creatinine, Ser: 1.29 mg/dL — ABNORMAL HIGH (ref 0.61–1.24)
GFR, Estimated: 56 mL/min — ABNORMAL LOW (ref >60)
Glucose, Bld: 96 mg/dL (ref 70–99)
Potassium: 4.6 mmol/L (ref 3.5–5.1)
Sodium: 136 mmol/L (ref 135–145)

## 2020-09-17 LAB — SARS CORONAVIRUS 2 (TAT 6-24 HRS): SARS Coronavirus 2: NEGATIVE

## 2020-09-17 LAB — MAGNESIUM: Magnesium: 2.2 mg/dL (ref 1.7–2.4)

## 2020-09-17 SURGERY — LOOP RECORDER INSERTION

## 2020-09-17 MED ORDER — ASPIRIN 81 MG PO TBEC: 81.0000 mg | DELAYED_RELEASE_TABLET | Freq: Every day | ORAL | 11 refills | Status: AC

## 2020-09-17 MED ORDER — LIDOCAINE-EPINEPHRINE 1 %-1:100000 IJ SOLN
INTRAMUSCULAR | Status: DC | PRN
  Administered 2020-09-17: 30 mL

## 2020-09-17 MED ORDER — LIDOCAINE-EPINEPHRINE 1 %-1:100000 IJ SOLN
INTRAMUSCULAR | Status: AC
  Filled 2020-09-17: qty 1

## 2020-09-17 SURGICAL SUPPLY — 2 items
MONITOR REVEAL LINQ II (Prosthesis & Implant Heart) ×2 IMPLANT
PACK LOOP INSERTION (CUSTOM PROCEDURE TRAY) ×2 IMPLANT

## 2020-09-17 NOTE — TOC Transition Note (Signed)
Transition of Care [facility]) - CM/SW Discharge Note   Patient Details  Name: Travis Mcconnell MRN: [id number] Date of Birth: June 09, 1940  [facility] of [facility]) CM/SW Contact:  Baldemar Lenis, LCSW Phone Number: 09/17/2020, 4:35 PM   Clinical Narrative:   Nurse to call report to [phone], Room 3205.    Final next level of care: Skilled Nursing Facility Barriers to Discharge: Barriers Resolved   Patient Goals and CMS Choice Patient states their goals for this hospitalization and ongoing recovery are:: Unable to assess patient disoriented CMS Medicare.gov Compare Post Acute Care list provided to:: Patient Represenative (must comment) Choice offered to / list presented to : Spouse  Discharge Placement              Patient chooses bed at: [facility] Patient to be transferred to facility by: [facility] Name of family member notified: Amil Amen Patient and family notified of of transfer: 09/17/20  Discharge Plan and Services                                     Social Determinants of Health (SDOH) Interventions     Readmission Risk Interventions No flowsheet data found.

## 2020-09-17 NOTE — Progress Notes (Signed)
Attempted to call report to [facility] x1

## 2020-09-17 NOTE — Progress Notes (Addendum)
Progress Note  Patient Name: Travis Mcconnell Date of Encounter: 09/17/2020  Attending physician: Nestor Ramp, MD Primary care provider: Patient, No Pcp Per (Inactive) Consultant:Natanael Saladin Odis Hollingshead DO, [facility]  Subjective: Travis Mcconnell is a 80 y.o. male who was seen and examined at bedside at approximately 9am No events overnight. Patient denies any chest pain or shortness of breath at rest or with effort related activities, no orthopnea, paroxysmal nocturnal dyspnea or lower extremity swelling. Complains of mild neck discomfort Telemetry reviewed.  Primary team present at bedside  Objective: Vital Signs in the last 24 hours: Temp:  [98.1 F (36.7 C)-98.7 F (37.1 C)] 98.2 F (36.8 C) (04/22 1609) Pulse Rate:  [67-87] 74 (04/22 1609) Resp:  [14-21] 20 (04/22 1609) BP: (108-148)/(74-94) 130/87 (04/22 1609) SpO2:  [93 %-100 %] 97 % (04/22 1609)  Intake/Output:  Intake/Output Summary (Last 24 hours) at 09/17/2020 1744 Last data filed at 09/17/2020 1300 Gross per 24 hour  Intake 560 ml  Output 840 ml  Net -280 ml    Net IO Since Admission: -949 mL [09/17/20 1744]  Telemetry: Personally reviewed- NSR w/o any significant ventricular ectopy.   Physical examination: PHYSICAL EXAM: Vitals with BMI 09/17/2020 09/17/2020 09/17/2020  Height - - -  Weight - - -  BMI - - -  Systolic 130 119 485  Diastolic 87 75 92  Pulse 74 68 77    CONSTITUTIONAL: Appears older than stated age, hemodynamically stable, No acute distress.  SKIN: Skin is warm and dry. No rash noted. No cyanosis. No pallor. No jaundice HEAD: Normocephalic and atraumatic.  EYES: No scleral icterus MOUTH/THROAT: Moist oral membranes.  NECK: No JVD present. No thyromegaly noted. No carotid bruits  LYMPHATIC: No visible cervical adenopathy.  CHEST Normal respiratory effort. No intercostal retractions  LUNGS: Clear to auscultation bilaterally.  No stridor. No wheezes. No rales.  CARDIOVASCULAR: Regular, positive S1-S2, no  murmurs rubs or gallops appreciated. ABDOMINAL: Soft, nontender, nondistended, positive bowel sounds no apparent ascites.  EXTREMITIES: No peripheral edema  HEMATOLOGIC: No significant bruising NEUROLOGIC: Awake, alert, responds to verbal and noxious stimuli, expressive aphasia, answers most questions in yes and no fashion, follows simple commands.  PSYCHIATRIC: Normal mood and affect. Normal behavior. Cooperative  Lab Results: Hematology Recent Labs  Lab 09/11/20 0228 09/12/20 1025 09/13/20 1045  WBC 4.5 4.2 4.3  RBC 5.08 5.38 5.39  HGB 14.5 15.4 15.2  HCT 44.6 46.2 45.1  MCV 87.8 85.9 83.7  MCH 28.5 28.6 28.2  MCHC 32.5 33.3 33.7  RDW 14.3 13.9 13.8  PLT 181 199 196    Chemistry Recent Labs  Lab 09/10/20 1840 09/10/20 1843 09/11/20 0228 09/12/20 1025 09/13/20 1049 09/15/20 0055 09/17/20 0415  NA 141   < > 140   < > 138 135 136  K 4.1   < > 3.6   < > 4.0 4.2 4.6  CL 110   < > 109   < > 106 103 103  CO2 23  --  26   < > 24 25 25   GLUCOSE 93   < > 85   < > 122* 104* 96  BUN 14   < > 10   < > 16 23 22   CREATININE 1.45*   < > 1.27*   < > 1.43* 1.25* 1.29*  CALCIUM 9.1  --  8.8*   < > 9.2 9.0 8.9  PROT 7.3  --  6.6  --   --   --   --  ALBUMIN 3.8  --  3.4*  --   --   --   --   AST 21  --  16  --   --   --   --   ALT 13  --  13  --   --   --   --   ALKPHOS 90  --  79  --   --   --   --   BILITOT 1.1  --  1.7*  --   --   --   --   GFRNONAA 49*  --  57*   < > 50* 58* 56*  ANIONGAP 8  --  5   < > 8 7 8    < > = values in this interval not displayed.     Cardiac Enzymes: Cardiac Panel (last 3 results) No results for input(s): CKTOTAL, CKMB, TROPONINIHS, RELINDX in the last 72 hours.  BNP (last 3 results) No results for input(s): BNP in the last 8760 hours.  ProBNP (last 3 results) No results for input(s): PROBNP in the last 8760 hours.   DDimer No results for input(s): DDIMER in the last 168 hours.   Hemoglobin A1c:  Lab Results  Component Value Date    HGBA1C 6.1 (H) 09/12/2020   MPG 128 09/12/2020    TSH  Recent Labs    09/15/20 0055  TSH 4.992*    Lipid Panel     Component Value Date/Time   CHOL 173 09/11/2020 0228   TRIG 36 09/11/2020 0228   HDL 59 09/11/2020 0228   CHOLHDL 2.9 09/11/2020 0228   VLDL 7 09/11/2020 0228   LDLCALC 107 (H) 09/11/2020 0228   RADIOLOGY: CT head without contrast: 09/10/2020: Acute cortical/subcortical infarct within the mid left frontal lobe. ASPECTS is 9.  Chronic lacunar infarcts within the bilateral corona radiata and basal ganglia/internal capsules bilaterally.  Background mild cerebral atrophy and advanced cerebral white matter chronic small vessel ischemic disease.  Small chronic infarct within the left cerebellar hemisphere.  CTA head and neck: 09/10/2020 CTA neck: 1. Suboptimal contrast timing. 2. The common carotid and internal carotid arteries are patent within the neck without hemodynamically significant stenosis. Mild atherosclerotic disease within the carotid systems within the neck bilaterally, as described. 3. The right vertebral artery is developmentally diminutive, but patent throughout the neck. 4. The left vertebral artery is patent in the neck. Moderate stenosis at the origin of this vessel. 5. Biapical bullous and paraseptal emphysema.  CTA head: 1. Suboptimal contrast timing limits evaluation. 2. The non dominant intracranial right vertebral artery is poorly delineated beyond the right PICA origin. This may be due to developmentally small vessel size, high-grade stenosis or occlusion. 3. Elsewhere, there is no evidence of intracranial large vessel occlusion. 4. Moderate stenosis of the P1 left posterior cerebral artery just proximal to its communication with the left posterior communicating artery. 5. Mild/moderate atherosclerotic narrowing of the paraclinoid internal carotid arteries bilaterally.  CT perfusion head: The perfusion software identifies no core  infarct. However, an acute left frontal lobe MCA territory infarct was present on the noncontrast head CT performed earlier today.  The perfusion software identifies a 7 mL region of hypoperfusion in the region of the inferior right cerebellum and medulla. Reported mismatch 7 mL.  MRI brain without contrast: 1. Multifocal acute ischemia within the anterior left MCA territory with petechial blood products ( Heidelberg classification 1b: HI2, confluent petechiae, no mass effect). 2. Moderate chronic small vessel ischemic changes.  CARDIAC DATABASE:  EKG: 09/10/2020: Normal sinus rhythm, 79 bpm, normal axis, IVCD without underlying injury pattern.  09/10/2020 1920: Sinus tachycardia, 101 bpm IVCD, frequent PVCs, without underlying injury pattern.  09/11/2020: Normal sinus rhythm, 83 bpm, occasional PVCs.  Echocardiogram: 09/11/2020: 1. Left ventricular ejection fraction, by estimation, is 50 to 55%. The  left ventricle has low normal function. The left ventricle demonstrates  regional wall motion abnormalities (see scoring diagram/findings for  description). The left ventricular  internal cavity size was moderately dilated. Left ventricular diastolic  parameters are consistent with Grade I diastolic dysfunction (impaired  relaxation).  2. Right ventricular systolic function is normal. The right ventricular  size is normal.  3. The mitral valve is normal in structure. Mild mitral valve regurgitation.  4. The aortic valve is normal in structure. Aortic valve regurgitation is not visualized.   Stress Testing:  None   Heart Catheterization: None   Scheduled Meds: . aspirin EC  81 mg Oral Daily  . atorvastatin  40 mg Oral Daily  . clopidogrel  75 mg Oral Daily  . docusate sodium  100 mg Oral BID  . enoxaparin (LOVENOX) injection  40 mg Subcutaneous QHS  . feeding supplement  237 mL Oral BID BM  . metoprolol tartrate  25 mg Oral BID  . multivitamin with minerals  1 tablet Oral  Daily  . polyethylene glycol  17 g Oral BID    Continuous Infusions:   PRN Meds: acetaminophen, benzocaine   IMPRESSION & RECOMMENDATIONS: DONTREAL MIERA is a 80 y.o. male whose past medical history and cardiac risk factors include: Osteoarthritis, former smoker.  Impression: Nonsustained ventricular tachycardia: resolved. PVCs, frequent: improved  Precordial pain-non cardiac: resolved.  Acute left MCA ischemic embolic stroke, embolic pattern concerning for cardioembolic etiology Chronic lacunar infarcts within the bilateral corona radiata and basal ganglia/internal capsules bilaterally Benign essential hypertension. Hyperlipidemia. Former smoker  Plan:  Cardiology was consulted for asymptomatic NSVT management noted on telemetry.  Echo reported low normal LVEF w/ RWMA; however, I independently reviewed his study and his LVEF is mildly reduced w/ RWMA.  Recommended ischemic evaluation prior to discharge but patient refused. Shared decision was to start pharmacological therapy.  He was started on Lopressor 25 mg p.o. twice daily. Since then patient's ventricular rate and ventricular ectopic burden has improved. Telemetry reviewed.  Over the last 24 hours no episodes of NSVT and very rare episodes of PVCs. Patient remains asymptomatic and denies any anginal discomfort.  Patient scheduled to undergo loop recorder implantation given his recent stroke.  Primary team is awaiting transfer to SNF.  Outpatient follow scheduled for 10/20/2020 at 11am at [facility].   Will sign off for now.  Please do not hesitate to reach out if any questions or concerns arise.  Patient's questions and concerns were addressed to his satisfaction. He voices understanding of the instructions provided during this encounter.   This note was created using a voice recognition software as a result there may be grammatical errors inadvertently enclosed that do not reflect the nature of this  encounter. Every attempt is made to correct such errors.  Delilah Shan [facility]  Pager: [phone] Office: [phone] 09/17/2020, 5:44 PM

## 2020-09-17 NOTE — Progress Notes (Signed)
Report called to Selena Batten RN at [facility].
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
## 2020-09-17 NOTE — Discharge Instructions (Signed)

## 2020-09-17 NOTE — Progress Notes (Signed)
Patient stable, ready to discharge to [facility]. Discharge paperwork placed in packet for receiving facility. Patient has all belongings at bedside, awaiting transport via [facility].

## 2020-09-17 NOTE — Progress Notes (Signed)
Initial Nutrition Assessment  DOCUMENTATION CODES:   Not applicable  INTERVENTION:  Continue Ensure Enlive po BID, each supplement provides 350 kcal and 20 grams of protein  Continue MVI with minerals daily  NUTRITION DIAGNOSIS:   Predicted suboptimal nutrient intake related to acute illness (stroke) as evidenced by other (comment) (consult to assess pt's nutritional status).  ongoing  GOAL:   Patient will meet greater than or equal to 90% of their needs  progressing  MONITOR:   PO intake,Supplement acceptance,Labs,Weight trends,I & O's  REASON FOR ASSESSMENT:   Consult Malnutrition Eval,Other (Comment) (low albumin)  ASSESSMENT:   Pt admitted with acute L anterior MCA stroke. PMH includes OA s/p total R knee replacement.  Pt to discharge to SNF. Per MD, pt with improved strength and control of R arm/hand. Pt remains dysarthric but this has significantly improved since admission.   Pt has had great intake since last RD assessment. Last 8 meal completions charted as 75-100% (86% average meal intake). Per RN, pt doing well with supplements (Ensure BID). Continue current nutrition plan of care.   UOP: dcoumented x24 hours  Medications: colace, miralax, mvi with minerals Labs reviewed.   Diet Order:   Diet Order            Diet regular Room service appropriate? Yes; Fluid consistency: Thin  Diet effective now                 EDUCATION NEEDS:   No education needs have been identified at this time  Skin:  Skin Assessment: Reviewed RN Assessment  Last BM:  4/19  Height:   Ht Readings from Last 1 Encounters:  09/11/20 5\' 11"  (1.803 m)    Weight:   Wt Readings from Last 1 Encounters:  09/11/20 87.1 kg   BMI:  Body mass index is 26.78 kg/m.  Estimated Nutritional Needs:   Kcal:  2000-2200  Protein:  100-110 grams  Fluid:  >2L/d    09/13/20, MS, RD, LDN RD pager number and weekend/on-call pager number located in Amion.

## 2020-09-30 ENCOUNTER — Other Ambulatory Visit: Payer: Self-pay

## 2020-09-30 ENCOUNTER — Ambulatory Visit (INDEPENDENT_AMBULATORY_CARE_PROVIDER_SITE_OTHER): Payer: No Typology Code available for payment source | Admitting: Emergency Medicine

## 2020-09-30 DIAGNOSIS — I63412 Cerebral infarction due to embolism of left middle cerebral artery: Secondary | ICD-10-CM

## 2020-09-30 LAB — CUP PACEART INCLINIC DEVICE CHECK
Date Time Interrogation Session: 20220505155638
Implantable Pulse Generator Implant Date: 20220422

## 2020-09-30 NOTE — Patient Instructions (Addendum)
Please plug in your remote monitor next to your bed (within 4 feet) with nothing between you or the monitor.  Please call the Device Clinic [phone] with further questions or concerns.

## 2020-09-30 NOTE — Progress Notes (Signed)
ILR wound check in clinic. Steri strips removed. Edges are not approximated. Dr. Ladona Ridgel in to assess and recommends wiping around wound with alcohol and reapplying steri-strips. Completed by this RN.  Patient given remote monitor at his visit, connected by this RN and patient educated on use and to plug in beside his bed when he gets home. No episodes. Questions answered.

## 2020-10-07 ENCOUNTER — Ambulatory Visit: Payer: No Typology Code available for payment source

## 2020-10-19 NOTE — Progress Notes (Signed)
Follow up visit  Subjective:   Travis Mcconnell, male    DOB: [birth year], 80 y.o.   MRN: [id number]   HPI  Chief Complaint  Patient presents with  . NSVT  . Hospitalization Follow-up    80 year old African-American male with hypertension, mixed hyperlipidemia, former smoker, h/o acute left ACA ischemic embolic stroke in 0/3500, NSVT.  Patient was hospitalized at [facility].  Given suspicion for cardiac source, he underwent loop recorder placement to look for A. fib.  TEE was not specifically requested by neurology.  His echocardiogram did show wall motion abnormalities, as detailed below.  He is here for further management of NSVT.  Patient is stable at [facility] rehab facility.  He does have difficulty recounting certain events, so history is limited.  He is able to ambulate without any support.  He denies any chest pain, shortness of breath, orthopnea, PND, leg edema symptoms.  Also denies any palpitations.   Current Outpatient Medications on File Prior to Visit  Medication Sig Dispense Refill  . acetaminophen (TYLENOL) 325 MG tablet Take 2 tablets (650 mg total) by mouth every 6 (six) hours as needed for mild pain.    Marland Kitchen aspirin 81 MG EC tablet Take 1 tablet (81 mg total) by mouth daily. Swallow whole. 30 tablet 11  . atorvastatin (LIPITOR) 40 MG tablet Take 1 tablet (40 mg total) by mouth daily.    . benzocaine (ORAJEL) 10 % mucosal gel Use as directed in the mouth or throat 2 (two) times daily as needed for mouth pain. 5.3 g 0  . docusate sodium (COLACE) 100 MG capsule Take 1 capsule (100 mg total) by mouth 2 (two) times daily. (Patient not taking: No sig reported) 28 capsule 0  . feeding supplement (ENSURE ENLIVE / ENSURE PLUS) LIQD Take 237 mLs by mouth 2 (two) times daily between meals. 237 mL 12  . ferrous sulfate 325 (65 FE) MG EC tablet Take 325 mg by mouth daily with breakfast.    . HYDROcodone-acetaminophen (NORCO) 5-325 MG tablet Take 1-2 tablets by mouth every 4  (four) hours as needed for moderate pain or severe pain. 60 tablet 0  . methocarbamol (ROBAXIN) 500 MG tablet Take 1 tablet (500 mg total) by mouth every 6 (six) hours as needed for muscle spasms. (Patient not taking: No sig reported) 40 tablet 0  . metoprolol tartrate (LOPRESSOR) 25 MG tablet Take 1 tablet (25 mg total) by mouth 2 (two) times daily.    . Multiple Vitamin (MULTIVITAMIN WITH MINERALS) TABS tablet Take 1 tablet by mouth daily.    . polyethylene glycol (MIRALAX / GLYCOLAX) 17 g packet Take 17 g by mouth 2 (two) times daily. (Patient not taking: No sig reported) 28 packet 0   No current facility-administered medications on file prior to visit.    Cardiovascular & other pertient studies:  EKG 10/20/2020: Sinus rhythm 59 bpm Nonspecific QRS widening Consider old anterior infarct   Echocardiogram 09/11/2020: 1. Left ventricular ejection fraction, by estimation, is 50 to 55%. The  left ventricle has low normal function. The left ventricle demonstrates  regional wall motion abnormalities. 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  The basal inferoseptal segment is dyskinetic. The apex is akinetic.  The apical septal segment and apical inferior segment are hypokinetic.  The entire anterior wall, entire lateral wall, anterior septum, mid inferoseptal segment, and mid inferior segment  are normal. The left ventricular internal cavity size was moderately dilated. Left ventricular diastolic  parameters are consistent  with Grade I diastolic dysfunction (impaired relaxation).  2. Right ventricular systolic function is normal. The right ventricular  size is normal.  3. The mitral valve is normal in structure. Mild mitral valve  regurgitation.  4. The aortic valve is normal in structure. Aortic valve regurgitation is  not visualized.   MRI brain 09/11/2020: 1. Multifocal acute ischemia within the anterior left MCA territory with petechial blood products ( Heidelberg classification  1b: HI2, confluent petechiae, no mass effect). 2. Moderate chronic small vessel ischemic changes.  Recent labs: 09/17/2020: Glucose 96, BUN/Cr 22/1.29. EGFR 56. Na/K 136/4.6.  H/H 15/45. MCV 83. Platelets 196 HbA1C 6.1% Chol 173, TG 36, HDL 59, LDL 107 TSH 4.9 normal   Review of Systems  Cardiovascular: Negative for chest pain, dyspnea on exertion, leg swelling, palpitations and syncope.         Vitals:   10/20/20 1041  BP: (!) 158/88  Pulse: 68  Resp: 16  Temp: 97.8 F (36.6 C)  SpO2: 100%     Body mass index is 27.62 kg/m. Filed Weights   10/20/20 1041  Weight: 198 lb (89.8 kg)     Objective:   Physical Exam Vitals and nursing note reviewed.  Constitutional:      General: He is not in acute distress. Neck:     Vascular: No JVD.  Cardiovascular:     Rate and Rhythm: Normal rate and regular rhythm.     Pulses:          Dorsalis pedis pulses are 0 on the right side and 0 on the left side.       Posterior tibial pulses are 0 on the right side and 0 on the left side.     Heart sounds: Normal heart sounds. No murmur heard.   Pulmonary:     Effort: Pulmonary effort is normal.     Breath sounds: Normal breath sounds. No wheezing or rales.  Musculoskeletal:     Right lower leg: No edema.     Left lower leg: No edema.  Neurological:     Comments: Mild RUE and RLE weakness           Assessment & Recommendations:   80 year old African-American male with hypertension, mixed hyperlipidemia, former smoker, h/o acute left ACA ischemic embolic stroke in 11/6193, NSVT.  NSVT: Currently in sinus rhythm. Recommend 2 week monitor to evaluate PVC and NSVT burden. If no recurrent NSVT, will hold off ischemic workup. He does not have any angina or heart failure symptoms at this time.  Continue Aspirin, statin  H/o stroke: S/p loop recorder placement. No reported alerts. Managed by [facility].  F/u in 4-6 weeks   Travis Mormon, MD Pager:  [phone] Office: [phone]

## 2020-10-20 ENCOUNTER — Other Ambulatory Visit: Payer: Self-pay

## 2020-10-20 ENCOUNTER — Inpatient Hospital Stay: Payer: No Typology Code available for payment source

## 2020-10-20 ENCOUNTER — Ambulatory Visit: Payer: No Typology Code available for payment source | Admitting: Cardiology

## 2020-10-20 ENCOUNTER — Encounter: Payer: Self-pay | Admitting: Cardiology

## 2020-10-20 VITALS — BP 158/88 | HR 68 | Temp 97.8°F | Resp 16 | Ht 71.0 in | Wt 198.0 lb

## 2020-10-20 DIAGNOSIS — I4729 Other ventricular tachycardia: Secondary | ICD-10-CM

## 2020-10-20 DIAGNOSIS — Z8673 Personal history of transient ischemic attack (TIA), and cerebral infarction without residual deficits: Secondary | ICD-10-CM

## 2020-10-20 DIAGNOSIS — I472 Ventricular tachycardia: Secondary | ICD-10-CM

## 2020-11-02 ENCOUNTER — Ambulatory Visit (INDEPENDENT_AMBULATORY_CARE_PROVIDER_SITE_OTHER): Payer: No Typology Code available for payment source

## 2020-11-02 DIAGNOSIS — I63412 Cerebral infarction due to embolism of left middle cerebral artery: Secondary | ICD-10-CM | POA: Diagnosis not present

## 2020-11-03 LAB — CUP PACEART REMOTE DEVICE CHECK
Date Time Interrogation Session: 20220607145439
Implantable Pulse Generator Implant Date: 20220422

## 2020-11-05 ENCOUNTER — Ambulatory Visit: Payer: Self-pay | Admitting: Cardiology

## 2020-11-18 ENCOUNTER — Encounter: Payer: Self-pay | Admitting: Adult Health

## 2020-11-18 ENCOUNTER — Inpatient Hospital Stay: Payer: Medicare Other | Admitting: Adult Health
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
## 2020-11-24 NOTE — Progress Notes (Signed)
Carelink Summary Report / Loop Recorder 

## 2020-11-26 ENCOUNTER — Ambulatory Visit: Payer: No Typology Code available for payment source | Admitting: Cardiology

## 2020-11-26 NOTE — Progress Notes (Deleted)
Follow up visit  Subjective:   Travis Mcconnell, male    DOB: 11-Jul-1940, 79 y.o.   MRN: [id number]   HPI  No chief complaint on file.   80 year old African-American male with hypertension, mixed hyperlipidemia, former smoker, h/o acute left ACA ischemic embolic stroke in 11/6158, NSVT.  Patient was hospitalized at [facility]  Given suspicion for cardiac source, he underwent loop recorder placement to look for A. fib.  TEE was not specifically requested by neurology.  His echocardiogram did show wall motion abnormalities, as detailed below.  He is here for further management of NSVT.  Patient is stable at [facility] rehab facility.  He does have difficulty recounting certain events, so history is limited.  He is able to ambulate without any support.  He denies any chest pain, shortness of breath, orthopnea, PND, leg edema symptoms.  Also denies any palpitations.   Current Outpatient Medications on File Prior to Visit  Medication Sig Dispense Refill   acetaminophen (TYLENOL) 325 MG tablet Take 2 tablets (650 mg total) by mouth every 6 (six) hours as needed for mild pain.     aspirin 81 MG EC tablet Take 1 tablet (81 mg total) by mouth daily. Swallow whole. 30 tablet 11   atorvastatin (LIPITOR) 40 MG tablet Take 1 tablet (40 mg total) by mouth daily.     benzocaine (ORAJEL) 10 % mucosal gel Use as directed in the mouth or throat 2 (two) times daily as needed for mouth pain. 5.3 g 0   cholecalciferol (VITAMIN D3) 25 MCG (1000 UNIT) tablet Take 1,000 Units by mouth daily.     feeding supplement (ENSURE ENLIVE / ENSURE PLUS) LIQD Take 237 mLs by mouth 2 (two) times daily between meals. 237 mL 12   ferrous sulfate 325 (65 FE) MG EC tablet Take 325 mg by mouth daily with breakfast.     gabapentin (NEURONTIN) 300 MG capsule      HYDROcodone-acetaminophen (NORCO) 5-325 MG tablet Take 1-2 tablets by mouth every 4 (four) hours as needed for moderate pain or severe pain. 60 tablet 0    methocarbamol (ROBAXIN) 500 MG tablet Take 1 tablet (500 mg total) by mouth every 6 (six) hours as needed for muscle spasms. 40 tablet 0   metoprolol tartrate (LOPRESSOR) 25 MG tablet Take 1 tablet (25 mg total) by mouth 2 (two) times daily.     Multiple Vitamin (MULTIVITAMIN WITH MINERALS) TABS tablet Take 1 tablet by mouth daily.     No current facility-administered medications on file prior to visit.    Cardiovascular & other pertient studies:  EKG 10/20/2020: Sinus rhythm 59 bpm Nonspecific QRS widening Consider old anterior infarct   Echocardiogram 09/11/2020:  1. Left ventricular ejection fraction, by estimation, is 50 to 55%. The  left ventricle has low normal function. The left ventricle demonstrates  regional wall motion abnormalities. 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  The basal inferoseptal segment is dyskinetic. The apex is akinetic.  The apical septal segment and apical inferior segment are hypokinetic.  The entire anterior wall, entire lateral wall, anterior septum, mid inferoseptal segment, and mid inferior segment  are normal. The left ventricular internal cavity size was moderately dilated. Left ventricular diastolic  parameters are consistent with Grade I diastolic dysfunction (impaired relaxation).   2. Right ventricular systolic function is normal. The right ventricular  size is normal.   3. The mitral valve is normal in structure. Mild mitral valve  regurgitation.   4. The aortic  valve is normal in structure. Aortic valve regurgitation is  not visualized.   MRI brain 09/11/2020: 1. Multifocal acute ischemia within the anterior left MCA territory with petechial blood products ( Heidelberg classification 1b: HI2, confluent petechiae, no mass effect). 2. Moderate chronic small vessel ischemic changes.   Recent labs: 09/17/2020: Glucose 96, BUN/Cr 22/1.29. EGFR 56. Na/K 136/4.6.  H/H 15/45. MCV 83. Platelets 196 HbA1C 6.1% Chol 173, TG 36, HDL 59, LDL  107 TSH 4.9 normal   Review of Systems  Cardiovascular:  Negative for chest pain, dyspnea on exertion, leg swelling, palpitations and syncope.        There were no vitals filed for this visit.    There is no height or weight on file to calculate BMI. There were no vitals filed for this visit.    Objective:   Physical Exam Vitals and nursing note reviewed.  Constitutional:      General: He is not in acute distress. Neck:     Vascular: No JVD.  Cardiovascular:     Rate and Rhythm: Normal rate and regular rhythm.     Pulses:          Dorsalis pedis pulses are 0 on the right side and 0 on the left side.       Posterior tibial pulses are 0 on the right side and 0 on the left side.     Heart sounds: Normal heart sounds. No murmur heard. Pulmonary:     Effort: Pulmonary effort is normal.     Breath sounds: Normal breath sounds. No wheezing or rales.  Musculoskeletal:     Right lower leg: No edema.     Left lower leg: No edema.  Neurological:     Comments: Mild RUE and RLE weakness          Assessment & Recommendations:   80 year old African-American male with hypertension, mixed hyperlipidemia, former smoker, h/o acute left ACA ischemic embolic stroke in 09/9161, NSVT.  NSVT: Currently in sinus rhythm. Recommend 2 week monitor to evaluate PVC and NSVT burden. If no recurrent NSVT, will hold off ischemic workup. He does not have any angina or heart failure symptoms at this time.  Continue Aspirin, statin  H/o stroke: S/p loop recorder placement. No reported alerts. Managed by [facility].  F/u in 4-6 weeks   [name], MD Pager: [phone] Office: [phone]

## 2021-09-30 ENCOUNTER — Emergency Department (HOSPITAL_COMMUNITY)
Admission: EM | Admit: 2021-09-30 | Discharge: 2021-09-30 | Disposition: A | Discharge status: Home / Self Care | Payer: No Typology Code available for payment source | Attending: Emergency Medicine | Admitting: Emergency Medicine

## 2021-09-30 ENCOUNTER — Encounter (HOSPITAL_COMMUNITY): Payer: Self-pay | Admitting: Emergency Medicine

## 2021-09-30 DIAGNOSIS — Z7982 Long term (current) use of aspirin: Secondary | ICD-10-CM | POA: Insufficient documentation

## 2021-09-30 DIAGNOSIS — H6122 Impacted cerumen, left ear: Secondary | ICD-10-CM | POA: Insufficient documentation

## 2021-09-30 NOTE — ED Provider Notes (Signed)
?[facility] EMERGENCY DEPARTMENT ?Provider Note ? ? ?CSN: [id number] ?Arrival date & time: 09/30/21  6503 ? ?  ? ?History ? ?Chief Complaint  ?Patient presents with  ? Cerumen Impaction  ? ? ?Travis Mcconnell is a 81 y.o. male. ? ?The history is provided by the patient. No language interpreter was used.  ? ?81 year old male presenting complaining of left ear irritation.  Patient states he felt like something is in his left ear.  He does not endorse any significant pain no ringing sounds in the ear no headache no chest pain no trouble breathing no neck pain and no trauma.  Nothing draining out from the ear.  It does not hurt when he lays on his ear. ? ?Home Medications ?Prior to Admission medications   ?Medication Sig Start Date End Date Taking? Authorizing Provider  ?acetaminophen (TYLENOL) 325 MG tablet Take 2 tablets (650 mg total) by mouth every 6 (six) hours as needed for mild pain. 09/16/20   Fayette Pho, MD  ?aspirin 81 MG EC tablet Take 1 tablet (81 mg total) by mouth daily. Swallow whole. 09/17/20   Fayette Pho, MD  ?atorvastatin (LIPITOR) 40 MG tablet Take 1 tablet (40 mg total) by mouth daily. 09/17/20   Fayette Pho, MD  ?benzocaine (ORAJEL) 10 % mucosal gel Use as directed in the mouth or throat 2 (two) times daily as needed for mouth pain. 09/16/20   Fayette Pho, MD  ?cholecalciferol (VITAMIN D3) 25 MCG (1000 UNIT) tablet Take 1,000 Units by mouth daily.    [provider]  ?feeding supplement (ENSURE ENLIVE / ENSURE PLUS) LIQD Take 237 mLs by mouth 2 (two) times daily between meals. 09/16/20   Fayette Pho, MD  ?ferrous sulfate 325 (65 FE) MG EC tablet Take 325 mg by mouth daily with breakfast.    [provider]  ?gabapentin (NEURONTIN) 300 MG capsule     [provider]  ?HYDROcodone-acetaminophen (NORCO) 5-325 MG tablet Take 1-2 tablets by mouth every 4 (four) hours as needed for moderate pain or severe pain. 12/12/19   Lanney Gins, PA-C   ?methocarbamol (ROBAXIN) 500 MG tablet Take 1 tablet (500 mg total) by mouth every 6 (six) hours as needed for muscle spasms. 12/12/19   Lanney Gins, PA-C  ?metoprolol tartrate (LOPRESSOR) 25 MG tablet Take 1 tablet (25 mg total) by mouth 2 (two) times daily. 09/16/20   Fayette Pho, MD  ?Multiple Vitamin (MULTIVITAMIN WITH MINERALS) TABS tablet Take 1 tablet by mouth daily. 09/17/20   Fayette Pho, MD  ?   ? ?Allergies    ?Dewitts pain reliever [magnesium salicylate], Other, and Tramadol   ? ?Review of Systems   ?Review of Systems  ?All other systems reviewed and are negative. ? ?Physical Exam ?Updated Vital Signs ?BP (!) 174/99 (BP Location: Left Arm)   Pulse 86   Temp 98 ?F (36.7 ?C) (Oral)   Resp 16   Ht 5' 10.5" (1.791 m)   Wt 86.2 kg   SpO2 99%   BMI 26.88 kg/m?  ?Physical Exam ?Vitals and nursing note reviewed.  ?Constitutional:   ?   General: He is not in acute distress. ?   Appearance: He is well-developed.  ?HENT:  ?   Head: Atraumatic.  ?   Right Ear: Tympanic membrane normal.  ?   Ears:  ?   Comments: Left ear: Evidence of cerumen in left ear but TM visualized and are normal.  Normal ear canal.  No tenderness to manipulation  of left earlobe.  Hearing is intact. ?Right ear: Normal TM, normal ear canal. ?Eyes:  ?   Conjunctiva/sclera: Conjunctivae normal.  ?Musculoskeletal:  ?   Cervical back: Neck supple.  ?Skin: ?   Findings: No rash.  ?Neurological:  ?   Mental Status: He is alert.  ? ? ?ED Results / Procedures / Treatments   ?Labs ?(all labs ordered are listed, but only abnormal results are displayed) ?Labs Reviewed - No data to display ? ?EKG ?None ? ?Radiology ?No results found. ? ?Procedures ?Procedures  ? ? ?Medications Ordered in ED ?Medications - No data to display ? ?ED Course/ Medical Decision Making/ A&P ?  ?                        ?Medical Decision Making ? ?BP (!) 174/99 (BP Location: Left Arm)   Pulse 86   Temp 98 ?F (36.7 ?C) (Oral)   Resp 16   Ht 5' 10.5" (1.791 m)    Wt 86.2 kg   SpO2 99%   BMI 26.88 kg/m?  ? ?10:02 AM ?Patient endorsed "something is in my left ear".  He denies having any pain or ringing sounds or any loss of hearing.  On exam he does have some cerumen buildup in his left ear with normal TM.  Normal right ear.  We will help irrigate ear and perform cerumen disimpaction.  He does not endorse any chest pain or referred pain to his ear to suggest ACS.  No signs of barotrauma.  No signs of infection.  I have reviewed patient's medications list and have not noticed any medication that can cause ototoxicity. ? ?10:32 AM ?After ear irrigation, cerumen was fully disimpacted.  TM normal appearance.  Patient states he felt better.  I have considered obtaining advanced imaging but at this time I have low suspicion for infectious source causing discomfort, doubt stroke, doubt barotrauma.  I did discuss care with Dr. Renaye Rakers.  Will discharge home, return precaution given. ? ? ? ? ? ? ? ?Final Clinical Impression(s) / ED Diagnoses ?Final diagnoses:  ?Impacted cerumen of left ear  ? ? ?Rx / DC Orders ?ED Discharge Orders   ? ? None  ? ?  ? ? ?  ?Fayrene Helper, PA-C ?09/30/21 1033 ? ?  ?Terald Sleeper, MD ?09/30/21 1042 ? ?

## 2021-09-30 NOTE — ED Triage Notes (Signed)
Pt. Stated, I think I got something in my ear, wax or something ?

## 2023-03-26 IMAGING — CT CT HEAD CODE STROKE
4 series · 16 of 47 positions shown, 18 images · non-contrast
Comparison: No pertinent prior exams available for comparison.

CLINICAL DATA: Code stroke. Neuro deficit, acute, stroke suspected.

EXAM:
CT HEAD WITHOUT CONTRAST
TECHNIQUE: Contiguous axial images were obtained from the base of the skull
through the vertex without intravenous contrast.

[Series 2: head wo · axial · 0.45mm/px · z∈[+1126,+1242]mm · 6 of 33 slices shown, 8 images]
[im 5/33  brain]
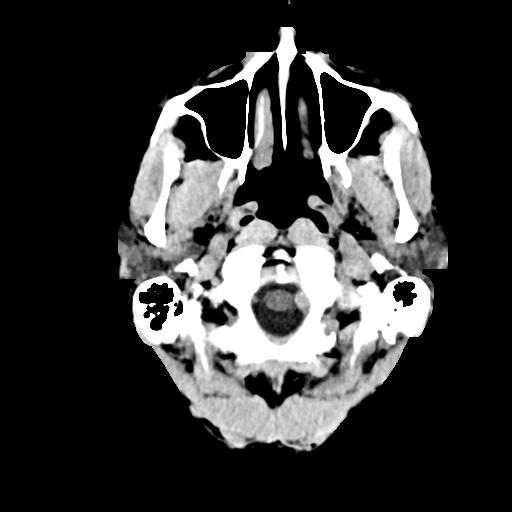
[im 5/33  bone]
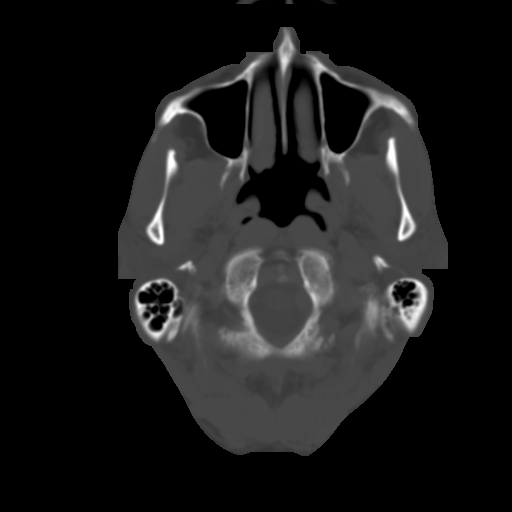
[im 10/33  brain]
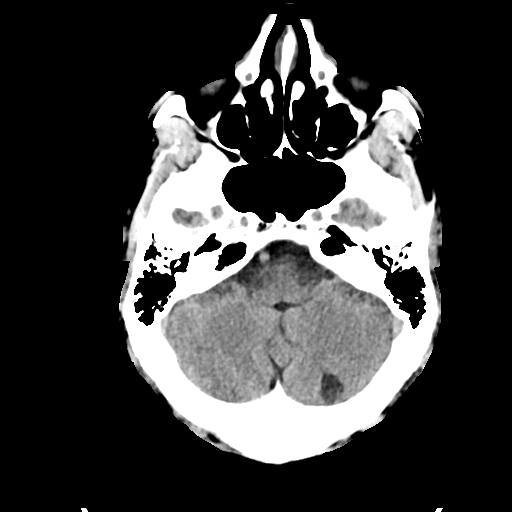
[im 14/33  brain]
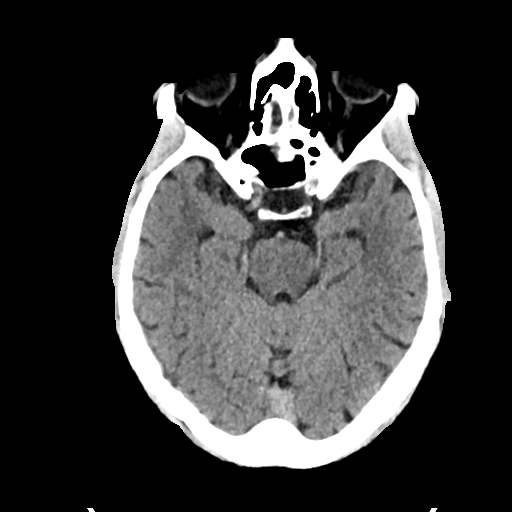
[im 19/33  brain]
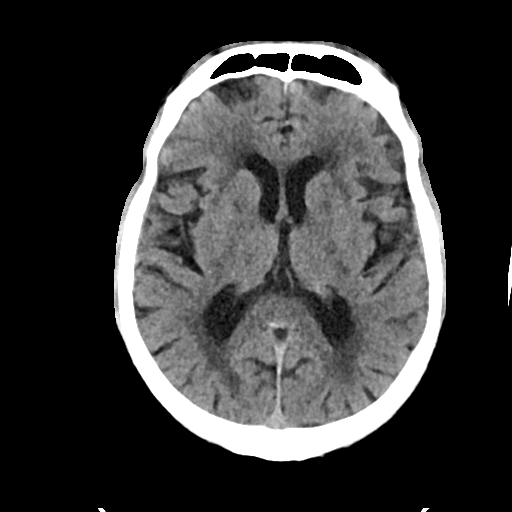
[im 23/33  brain]
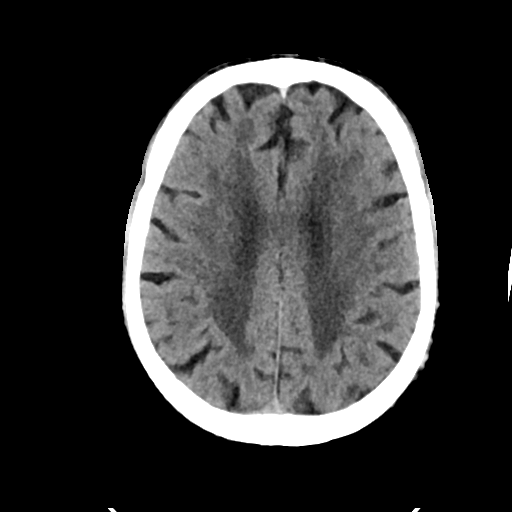
[im 23/33  bone]
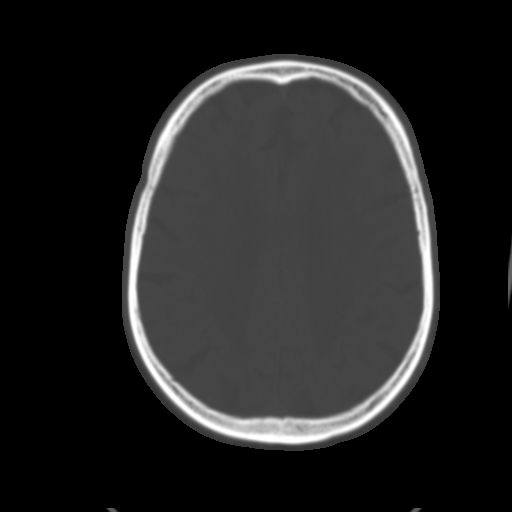
[im 28/33  brain]
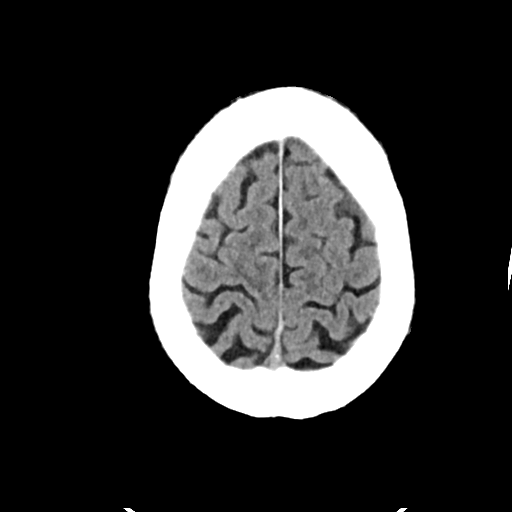

[Series 4: head bone · axial · 0.45mm/px · z∈[+1122,+1178]mm · 4 of 85 slices shown]
[im 9/85  bone]
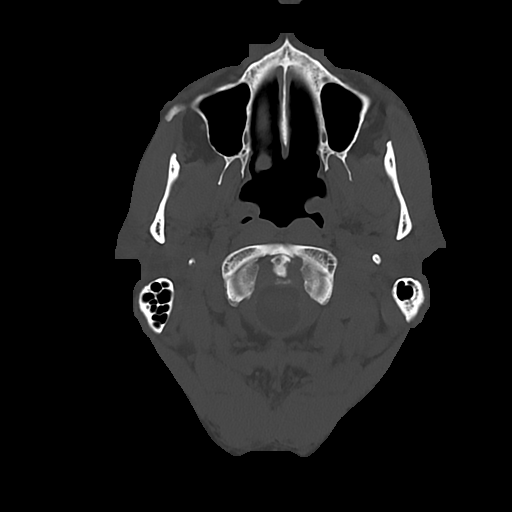
[im 17/85  bone]
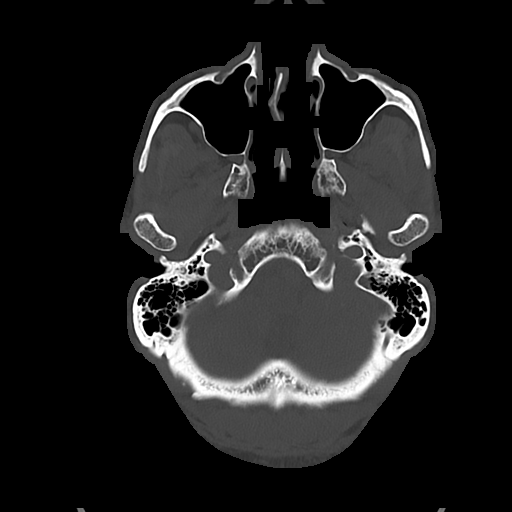
[im 29/85  bone]
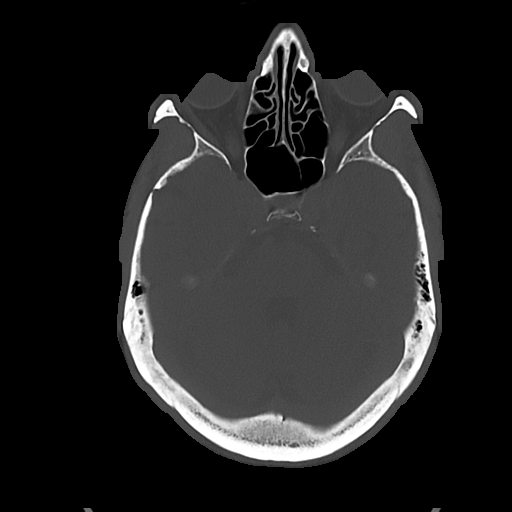
[im 37/85  bone]
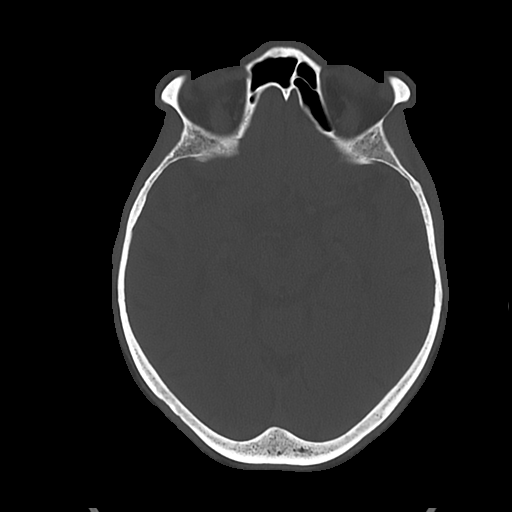

[Series 5: cor soft · coronal · 0.35mm/px · 3 of 76 slices shown]
[im 26/76  brain]
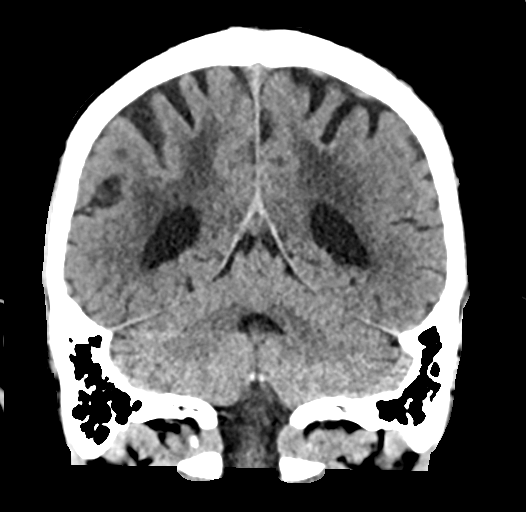
[im 34/76  brain]
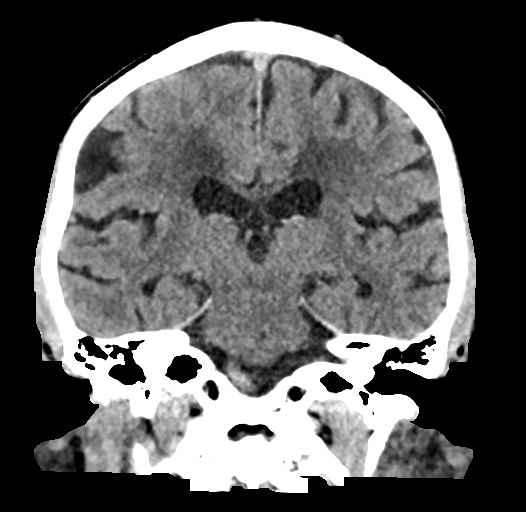
[im 42/76  brain]
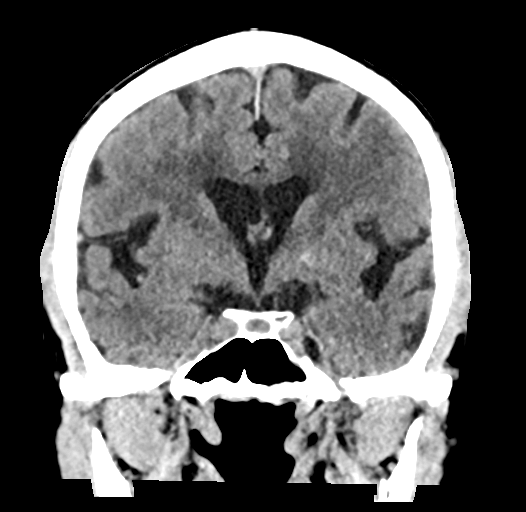

[Series 6: sag soft · sagittal · 0.33mm/px · 3 of 59 slices shown]
[im 20/59  brain]
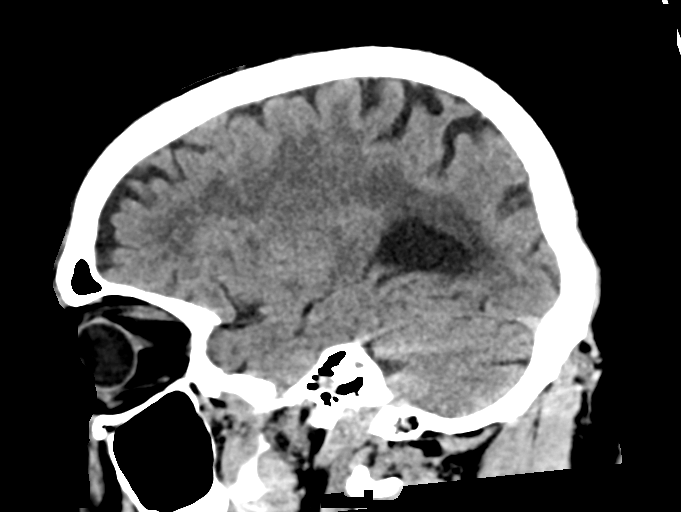
[im 30/59  brain]
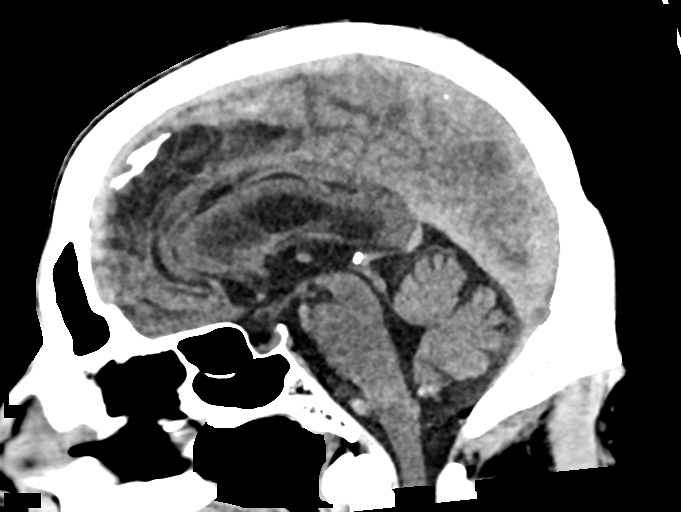
[im 39/59  brain]
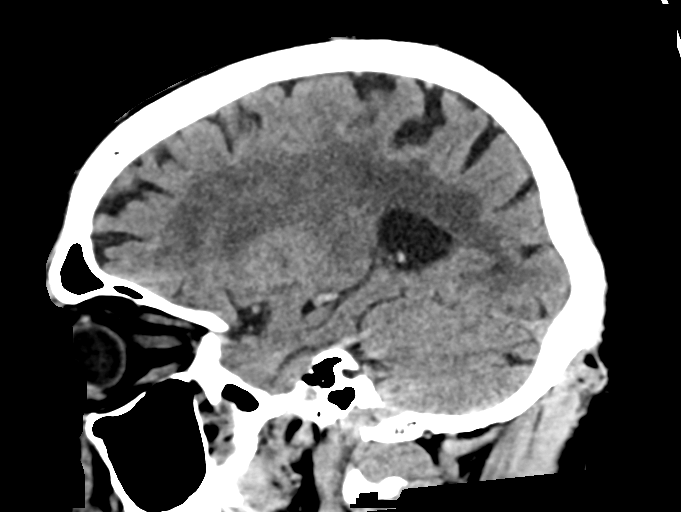

[16 of 47 positions shown; findings below may reference images not displayed]

FINDINGS: Brain:

Mild cerebral atrophy.

Abnormal cortical/subcortical hypodensity within the mid left
frontal lobe compatible with acute infarction (for instance as seen
on series 2, image 25).

Chronic lacunar infarcts within the bilateral corona radiata and
basal ganglia/internal capsules.

Background advanced ill-defined hypoattenuation within the cerebral
white matter is nonspecific, but compatible with chronic small
vessel ischemic disease.

Small chronic infarct within the left cerebellar hemisphere.

There is no acute intracranial hemorrhage.

No extra-axial fluid collection.

No evidence of intracranial mass.

No midline shift.

Vascular: No hyperdense vessel.  Atherosclerotic calcifications.

Skull: Normal. Negative for fracture or focal lesion.

Sinuses/Orbits: Visualized orbits show no acute finding. No
significant paranasal sinus disease at the imaged levels.

ASPECTS (Alberta Stroke Program Early CT Score)

- Ganglionic level infarction (caudate, lentiform nuclei, internal
capsule, insula, M1-M3 cortex): 7

- Supraganglionic infarction (M4-M6 cortex): 2

Total score (0-10 with 10 being normal): 9

These results were communicated to Dr. Korca at [DATE] pmon
09/10/2020by text page via the [employer] messaging system.
IMPRESSION: Acute cortical/subcortical infarct within the mid left frontal lobe.
ASPECTS is 9.

Chronic lacunar infarcts within the bilateral corona radiata and
basal ganglia/internal capsules bilaterally.

Background mild cerebral atrophy and advanced cerebral white matter
chronic small vessel ischemic disease.

Small chronic infarct within the left cerebellar hemisphere.

## 2023-05-18 ENCOUNTER — Inpatient Hospital Stay
Admission: AD | Admit: 2023-05-18 | Discharge: 2023-09-17 | Disposition: A | Discharge status: Skilled Nursing Facility | Payer: Self-pay | Source: Ambulatory Visit | Attending: Internal Medicine | Admitting: Internal Medicine

## 2023-05-18 ENCOUNTER — Encounter (HOSPITAL_COMMUNITY): Payer: Self-pay

## 2023-05-18 ENCOUNTER — Encounter (HOSPITAL_BASED_OUTPATIENT_CLINIC_OR_DEPARTMENT_OTHER): Payer: Self-pay

## 2023-05-18 DIAGNOSIS — M7989 Other specified soft tissue disorders: Secondary | ICD-10-CM

## 2023-05-18 LAB — CBC
HCT: 40.7 % (ref 39.0–52.0)
Hemoglobin: 13.3 g/dL (ref 13.0–17.0)
MCH: 27.8 pg (ref 26.0–34.0)
MCHC: 32.7 g/dL (ref 30.0–36.0)
MCV: 85 fL (ref 80.0–100.0)
Platelets: 188 10*3/uL (ref 150–400)
RBC: 4.79 MIL/uL (ref 4.22–5.81)
RDW: 15.4 % (ref 11.5–15.5)
WBC: 11.6 10*3/uL — ABNORMAL HIGH (ref 4.0–10.5)
nRBC: 0 % (ref 0.0–0.2)

## 2023-05-18 LAB — HEPARIN LEVEL (UNFRACTIONATED): Heparin Unfractionated: >1.1 [IU]/mL — ABNORMAL HIGH (ref 0.30–0.70)

## 2023-05-18 LAB — APTT
aPTT: 35 s (ref 24–36)
aPTT: >200 s (ref 24–36)

## 2023-05-19 LAB — POTASSIUM: Potassium: 3.7 mmol/L (ref 3.5–5.1)

## 2023-05-19 LAB — C DIFFICILE QUICK SCREEN W PCR REFLEX
C Diff antigen: NEGATIVE
C Diff interpretation: NOT DETECTED
C Diff toxin: NEGATIVE

## 2023-05-19 LAB — COMPREHENSIVE METABOLIC PANEL
ALT: 116 U/L — ABNORMAL HIGH (ref 0–44)
AST: 66 U/L — ABNORMAL HIGH (ref 15–41)
Albumin: 2.3 g/dL — ABNORMAL LOW (ref 3.5–5.0)
Alkaline Phosphatase: 81 U/L (ref 38–126)
Anion gap: 7 (ref 5–15)
BUN: 27 mg/dL — ABNORMAL HIGH (ref 8–23)
CO2: 29 mmol/L (ref 22–32)
Calcium: 7.8 mg/dL — ABNORMAL LOW (ref 8.9–10.3)
Chloride: 100 mmol/L (ref 98–111)
Creatinine, Ser: 1.39 mg/dL — ABNORMAL HIGH (ref 0.61–1.24)
GFR, Estimated: 50 mL/min — ABNORMAL LOW (ref >60)
Glucose, Bld: 102 mg/dL — ABNORMAL HIGH (ref 70–99)
Potassium: 2.9 mmol/L — ABNORMAL LOW (ref 3.5–5.1)
Sodium: 136 mmol/L (ref 135–145)
Total Bilirubin: 2 mg/dL — ABNORMAL HIGH (ref <1.2)
Total Protein: 5.4 g/dL — ABNORMAL LOW (ref 6.5–8.1)

## 2023-05-19 LAB — HEPARIN LEVEL (UNFRACTIONATED)
Heparin Unfractionated: 0.47 [IU]/mL (ref 0.30–0.70)
Heparin Unfractionated: 0.82 [IU]/mL — ABNORMAL HIGH (ref 0.30–0.70)
Heparin Unfractionated: 0.38 [IU]/mL (ref 0.30–0.70)

## 2023-05-20 LAB — POTASSIUM
Potassium: 3.1 mmol/L — ABNORMAL LOW (ref 3.5–5.1)
Potassium: 3.7 mmol/L (ref 3.5–5.1)

## 2023-05-20 LAB — HEPARIN LEVEL (UNFRACTIONATED): Heparin Unfractionated: 0.36 [IU]/mL (ref 0.30–0.70)

## 2023-05-20 LAB — MAGNESIUM: Magnesium: 1.7 mg/dL (ref 1.7–2.4)

## 2023-05-21 ENCOUNTER — Other Ambulatory Visit (HOSPITAL_COMMUNITY): Payer: Self-pay

## 2023-05-21 LAB — CBC
HCT: 43.2 % (ref 39.0–52.0)
Hemoglobin: 14.7 g/dL (ref 13.0–17.0)
MCH: 28.8 pg (ref 26.0–34.0)
MCHC: 34 g/dL (ref 30.0–36.0)
MCV: 84.7 fL (ref 80.0–100.0)
Platelets: 245 10*3/uL (ref 150–400)
RBC: 5.1 MIL/uL (ref 4.22–5.81)
RDW: 15.8 % — ABNORMAL HIGH (ref 11.5–15.5)
WBC: 9.7 10*3/uL (ref 4.0–10.5)
nRBC: 0 % (ref 0.0–0.2)

## 2023-05-21 LAB — HEPARIN LEVEL (UNFRACTIONATED)
Heparin Unfractionated: <0.1 [IU]/mL — ABNORMAL LOW (ref 0.30–0.70)
Heparin Unfractionated: 0.57 [IU]/mL (ref 0.30–0.70)

## 2023-05-21 LAB — POTASSIUM: Potassium: 3.6 mmol/L (ref 3.5–5.1)

## 2023-05-21 LAB — MAGNESIUM: Magnesium: 1.7 mg/dL (ref 1.7–2.4)

## 2023-05-21 MED ORDER — IOHEXOL 350 MG/ML SOLN
75.0000 mL | Freq: Once | INTRAVENOUS | Status: AC | PRN
  Administered 2023-05-21: 75 mL via INTRAVENOUS

## 2023-05-22 LAB — HEPARIN LEVEL (UNFRACTIONATED): Heparin Unfractionated: 0.61 [IU]/mL (ref 0.30–0.70)

## 2023-05-23 LAB — BASIC METABOLIC PANEL
Anion gap: 14 (ref 5–15)
BUN: 45 mg/dL — ABNORMAL HIGH (ref 8–23)
CO2: 24 mmol/L (ref 22–32)
Calcium: 8.4 mg/dL — ABNORMAL LOW (ref 8.9–10.3)
Chloride: 101 mmol/L (ref 98–111)
Creatinine, Ser: 2.18 mg/dL — ABNORMAL HIGH (ref 0.61–1.24)
GFR, Estimated: 29 mL/min — ABNORMAL LOW (ref >60)
Glucose, Bld: 75 mg/dL (ref 70–99)
Potassium: 5.5 mmol/L — ABNORMAL HIGH (ref 3.5–5.1)
Sodium: 139 mmol/L (ref 135–145)

## 2023-05-23 LAB — POTASSIUM: Potassium: 4.4 mmol/L (ref 3.5–5.1)

## 2023-05-23 LAB — CBC
HCT: 39.6 % (ref 39.0–52.0)
Hemoglobin: 12.8 g/dL — ABNORMAL LOW (ref 13.0–17.0)
MCH: 28.3 pg (ref 26.0–34.0)
MCHC: 32.3 g/dL (ref 30.0–36.0)
MCV: 87.4 fL (ref 80.0–100.0)
Platelets: 171 10*3/uL (ref 150–400)
RBC: 4.53 MIL/uL (ref 4.22–5.81)
RDW: 16.5 % — ABNORMAL HIGH (ref 11.5–15.5)
WBC: 8.1 10*3/uL (ref 4.0–10.5)
nRBC: 0 % (ref 0.0–0.2)

## 2023-05-23 LAB — HEPARIN LEVEL (UNFRACTIONATED): Heparin Unfractionated: <0.1 [IU]/mL — ABNORMAL LOW (ref 0.30–0.70)

## 2023-05-25 LAB — CBC
HCT: 37.9 % — ABNORMAL LOW (ref 39.0–52.0)
Hemoglobin: 12.4 g/dL — ABNORMAL LOW (ref 13.0–17.0)
MCH: 28.2 pg (ref 26.0–34.0)
MCHC: 32.7 g/dL (ref 30.0–36.0)
MCV: 86.3 fL (ref 80.0–100.0)
Platelets: 152 10*3/uL (ref 150–400)
RBC: 4.39 MIL/uL (ref 4.22–5.81)
RDW: 15.7 % — ABNORMAL HIGH (ref 11.5–15.5)
WBC: 4.9 10*3/uL (ref 4.0–10.5)
nRBC: 0 % (ref 0.0–0.2)

## 2023-05-28 LAB — BASIC METABOLIC PANEL
Anion gap: 9 (ref 5–15)
BUN: 26 mg/dL — ABNORMAL HIGH (ref 8–23)
CO2: 23 mmol/L (ref 22–32)
Calcium: 8.8 mg/dL — ABNORMAL LOW (ref 8.9–10.3)
Chloride: 104 mmol/L (ref 98–111)
Creatinine, Ser: 1.28 mg/dL — ABNORMAL HIGH (ref 0.61–1.24)
GFR, Estimated: 56 mL/min — ABNORMAL LOW (ref >60)
Glucose, Bld: 105 mg/dL — ABNORMAL HIGH (ref 70–99)
Potassium: 4.5 mmol/L (ref 3.5–5.1)
Sodium: 136 mmol/L (ref 135–145)

## 2023-05-28 LAB — CBC
HCT: 41.3 % (ref 39.0–52.0)
Hemoglobin: 13.6 g/dL (ref 13.0–17.0)
MCH: 28.1 pg (ref 26.0–34.0)
MCHC: 32.9 g/dL (ref 30.0–36.0)
MCV: 85.3 fL (ref 80.0–100.0)
Platelets: 179 10*3/uL (ref 150–400)
RBC: 4.84 MIL/uL (ref 4.22–5.81)
RDW: 15.9 % — ABNORMAL HIGH (ref 11.5–15.5)
WBC: 4.5 10*3/uL (ref 4.0–10.5)
nRBC: 0 % (ref 0.0–0.2)

## 2023-05-28 LAB — MAGNESIUM: Magnesium: 2.1 mg/dL (ref 1.7–2.4)

## 2023-06-03 ENCOUNTER — Institutional Professional Consult (permissible substitution) (HOSPITAL_COMMUNITY): Payer: Self-pay

## 2023-06-03 LAB — CBC
HCT: 40.4 % (ref 39.0–52.0)
Hemoglobin: 13.7 g/dL (ref 13.0–17.0)
MCH: 28.6 pg (ref 26.0–34.0)
MCHC: 33.9 g/dL (ref 30.0–36.0)
MCV: 84.3 fL (ref 80.0–100.0)
Platelets: 225 10*3/uL (ref 150–400)
RBC: 4.79 MIL/uL (ref 4.22–5.81)
RDW: 16.5 % — ABNORMAL HIGH (ref 11.5–15.5)
WBC: 25.6 10*3/uL — ABNORMAL HIGH (ref 4.0–10.5)
nRBC: 0 % (ref 0.0–0.2)

## 2023-06-03 LAB — COMPREHENSIVE METABOLIC PANEL
ALT: 32 U/L (ref 0–44)
AST: 30 U/L (ref 15–41)
Albumin: 2.5 g/dL — ABNORMAL LOW (ref 3.5–5.0)
Alkaline Phosphatase: 115 U/L (ref 38–126)
Anion gap: 19 — ABNORMAL HIGH (ref 5–15)
BUN: 90 mg/dL — ABNORMAL HIGH (ref 8–23)
CO2: 18 mmol/L — ABNORMAL LOW (ref 22–32)
Calcium: 9.2 mg/dL (ref 8.9–10.3)
Chloride: 101 mmol/L (ref 98–111)
Creatinine, Ser: 7.51 mg/dL — ABNORMAL HIGH (ref 0.61–1.24)
GFR, Estimated: 7 mL/min — ABNORMAL LOW (ref >60)
Glucose, Bld: 178 mg/dL — ABNORMAL HIGH (ref 70–99)
Potassium: 5.7 mmol/L — ABNORMAL HIGH (ref 3.5–5.1)
Sodium: 138 mmol/L (ref 135–145)
Total Bilirubin: 1.9 mg/dL — ABNORMAL HIGH (ref 0.0–1.2)
Total Protein: 7.5 g/dL (ref 6.5–8.1)

## 2023-06-04 ENCOUNTER — Encounter (HOSPITAL_BASED_OUTPATIENT_CLINIC_OR_DEPARTMENT_OTHER): Payer: Self-pay

## 2023-06-04 ENCOUNTER — Institutional Professional Consult (permissible substitution) (HOSPITAL_COMMUNITY): Payer: Self-pay

## 2023-06-04 DIAGNOSIS — I10 Essential (primary) hypertension: Secondary | ICD-10-CM

## 2023-06-04 HISTORY — PX: IR US GUIDE VASC ACCESS RIGHT: IMG2390

## 2023-06-04 HISTORY — PX: IR FLUORO GUIDE CV LINE RIGHT: IMG2283

## 2023-06-04 LAB — COMPREHENSIVE METABOLIC PANEL
ALT: 39 U/L (ref 0–44)
AST: 45 U/L — ABNORMAL HIGH (ref 15–41)
Albumin: 2.2 g/dL — ABNORMAL LOW (ref 3.5–5.0)
Alkaline Phosphatase: 122 U/L (ref 38–126)
Anion gap: 16 — ABNORMAL HIGH (ref 5–15)
BUN: 111 mg/dL — ABNORMAL HIGH (ref 8–23)
CO2: 19 mmol/L — ABNORMAL LOW (ref 22–32)
Calcium: 8.7 mg/dL — ABNORMAL LOW (ref 8.9–10.3)
Chloride: 104 mmol/L (ref 98–111)
Creatinine, Ser: 8.89 mg/dL — ABNORMAL HIGH (ref 0.61–1.24)
GFR, Estimated: 5 mL/min — ABNORMAL LOW (ref >60)
Glucose, Bld: 128 mg/dL — ABNORMAL HIGH (ref 70–99)
Potassium: 5.7 mmol/L — ABNORMAL HIGH (ref 3.5–5.1)
Sodium: 139 mmol/L (ref 135–145)
Total Bilirubin: 1.9 mg/dL — ABNORMAL HIGH (ref 0.0–1.2)
Total Protein: 6.9 g/dL (ref 6.5–8.1)

## 2023-06-04 LAB — BASIC METABOLIC PANEL
Anion gap: 18 — ABNORMAL HIGH (ref 5–15)
BUN: 122 mg/dL — ABNORMAL HIGH (ref 8–23)
CO2: 17 mmol/L — ABNORMAL LOW (ref 22–32)
Calcium: 8.5 mg/dL — ABNORMAL LOW (ref 8.9–10.3)
Chloride: 105 mmol/L (ref 98–111)
Creatinine, Ser: 9.77 mg/dL — ABNORMAL HIGH (ref 0.61–1.24)
GFR, Estimated: 5 mL/min — ABNORMAL LOW (ref >60)
Glucose, Bld: 133 mg/dL — ABNORMAL HIGH (ref 70–99)
Potassium: 6 mmol/L — ABNORMAL HIGH (ref 3.5–5.1)
Sodium: 140 mmol/L (ref 135–145)

## 2023-06-04 LAB — MAGNESIUM: Magnesium: 2.3 mg/dL (ref 1.7–2.4)

## 2023-06-04 LAB — BLOOD CULTURE ID PANEL (REFLEXED) - BCID2

## 2023-06-04 LAB — LACTIC ACID, PLASMA
Lactic Acid, Venous: 1.9 mmol/L (ref 0.5–1.9)
Lactic Acid, Venous: 2.7 mmol/L (ref 0.5–1.9)

## 2023-06-04 LAB — C-REACTIVE PROTEIN: CRP: 46 mg/dL — ABNORMAL HIGH (ref <1.0)

## 2023-06-04 LAB — TROPONIN I (HIGH SENSITIVITY)
Troponin I (High Sensitivity): 62 ng/L — ABNORMAL HIGH (ref <18)
Troponin I (High Sensitivity): 89 ng/L — ABNORMAL HIGH (ref <18)

## 2023-06-04 LAB — POTASSIUM: Potassium: 5.8 mmol/L — ABNORMAL HIGH (ref 3.5–5.1)

## 2023-06-04 MED ORDER — LIDOCAINE HCL 1 % IJ SOLN
20.0000 mL | Freq: Once | INTRAMUSCULAR | Status: AC
  Administered 2023-06-04: 10 mL

## 2023-06-04 MED ORDER — HEPARIN SODIUM (PORCINE) 1000 UNIT/ML IJ SOLN
INTRAMUSCULAR | Status: AC
  Filled 2023-06-04: qty 10

## 2023-06-04 MED ORDER — LIDOCAINE HCL 1 % IJ SOLN
20.0000 mL | Freq: Once | INTRAMUSCULAR | Status: AC
  Administered 2023-06-28: 20 mL

## 2023-06-04 MED ORDER — LIDOCAINE HCL 1 % IJ SOLN
INTRAMUSCULAR | Status: AC
  Filled 2023-06-04: qty 20

## 2023-06-04 NOTE — Procedures (Signed)
 Interventional Radiology Procedure:   Indications: AKI  Procedure: Placement of non-tunneled dialysis catheter  Findings: Right jugular triple lumen dialysis catheter placed.  Tip at SVC/RA junction  Complications: None     EBL: Minimal  Plan: Catheter is ready to use.   Tylie Golonka R. Philip, MD  Pager: [phone]

## 2023-06-05 LAB — CBC WITH DIFFERENTIAL/PLATELET
Abs Immature Granulocytes: 0 10*3/uL (ref 0.00–0.07)
Basophils Absolute: 0 10*3/uL (ref 0.0–0.1)
Basophils Relative: 0 %
Eosinophils Absolute: 0 10*3/uL (ref 0.0–0.5)
Eosinophils Relative: 0 %
HCT: 36.7 % — ABNORMAL LOW (ref 39.0–52.0)
Hemoglobin: 12.3 g/dL — ABNORMAL LOW (ref 13.0–17.0)
Lymphocytes Relative: 8 %
Lymphs Abs: 1.4 10*3/uL (ref 0.7–4.0)
MCH: 27.9 pg (ref 26.0–34.0)
MCHC: 33.5 g/dL (ref 30.0–36.0)
MCV: 83.2 fL (ref 80.0–100.0)
Monocytes Absolute: 0.2 10*3/uL (ref 0.1–1.0)
Monocytes Relative: 1 %
Neutro Abs: 15.7 10*3/uL — ABNORMAL HIGH (ref 1.7–7.7)
Neutrophils Relative %: 91 %
Platelets: 171 10*3/uL (ref 150–400)
RBC: 4.41 MIL/uL (ref 4.22–5.81)
RDW: 16.2 % — ABNORMAL HIGH (ref 11.5–15.5)
WBC: 17.3 10*3/uL — ABNORMAL HIGH (ref 4.0–10.5)
nRBC: 0 % (ref 0.0–0.2)
nRBC: 1 /100{WBCs} — ABNORMAL HIGH

## 2023-06-05 LAB — HEPATITIS B SURFACE ANTIBODY,QUALITATIVE: Hep B S Ab: NONREACTIVE

## 2023-06-05 LAB — BASIC METABOLIC PANEL
Anion gap: 20 — ABNORMAL HIGH (ref 5–15)
BUN: 130 mg/dL — ABNORMAL HIGH (ref 8–23)
CO2: 19 mmol/L — ABNORMAL LOW (ref 22–32)
Calcium: 8.8 mg/dL — ABNORMAL LOW (ref 8.9–10.3)
Chloride: 103 mmol/L (ref 98–111)
Creatinine, Ser: 9.79 mg/dL — ABNORMAL HIGH (ref 0.61–1.24)
GFR, Estimated: 5 mL/min — ABNORMAL LOW (ref >60)
Glucose, Bld: 111 mg/dL — ABNORMAL HIGH (ref 70–99)
Potassium: 5.2 mmol/L — ABNORMAL HIGH (ref 3.5–5.1)
Sodium: 142 mmol/L (ref 135–145)

## 2023-06-05 LAB — TROPONIN I (HIGH SENSITIVITY): Troponin I (High Sensitivity): 87 ng/L — ABNORMAL HIGH (ref <18)

## 2023-06-05 LAB — HEPATITIS B SURFACE ANTIGEN: Hepatitis B Surface Ag: NONREACTIVE

## 2023-06-06 ENCOUNTER — Other Ambulatory Visit (HOSPITAL_COMMUNITY): Payer: Self-pay

## 2023-06-06 ENCOUNTER — Encounter (HOSPITAL_COMMUNITY): Payer: Self-pay

## 2023-06-06 LAB — BASIC METABOLIC PANEL
Anion gap: 18 — ABNORMAL HIGH (ref 5–15)
BUN: 107 mg/dL — ABNORMAL HIGH (ref 8–23)
CO2: 17 mmol/L — ABNORMAL LOW (ref 22–32)
Calcium: 7.6 mg/dL — ABNORMAL LOW (ref 8.9–10.3)
Chloride: 101 mmol/L (ref 98–111)
Creatinine, Ser: 8.75 mg/dL — ABNORMAL HIGH (ref 0.61–1.24)
GFR, Estimated: 6 mL/min — ABNORMAL LOW (ref >60)
Glucose, Bld: 102 mg/dL — ABNORMAL HIGH (ref 70–99)
Potassium: 6 mmol/L — ABNORMAL HIGH (ref 3.5–5.1)
Sodium: 136 mmol/L (ref 135–145)

## 2023-06-06 LAB — POTASSIUM
Potassium: 5.4 mmol/L — ABNORMAL HIGH (ref 3.5–5.1)
Potassium: 5.3 mmol/L — ABNORMAL HIGH (ref 3.5–5.1)

## 2023-06-06 LAB — CULTURE, BLOOD (ROUTINE X 2)

## 2023-06-06 LAB — BLOOD GAS, ARTERIAL
Acid-base deficit: 0.3 mmol/L (ref 0.0–2.0)
Bicarbonate: 20.9 mmol/L (ref 20.0–28.0)
O2 Saturation: 99.6 %
Patient temperature: 38.2
pCO2 arterial: 26 mm[Hg] — ABNORMAL LOW (ref 32–48)
pH, Arterial: 7.51 — ABNORMAL HIGH (ref 7.35–7.45)
pO2, Arterial: 102 mm[Hg] (ref 83–108)

## 2023-06-07 LAB — COMPREHENSIVE METABOLIC PANEL
ALT: 73 U/L — ABNORMAL HIGH (ref 0–44)
AST: 107 U/L — ABNORMAL HIGH (ref 15–41)
Albumin: 1.6 g/dL — ABNORMAL LOW (ref 3.5–5.0)
Alkaline Phosphatase: 141 U/L — ABNORMAL HIGH (ref 38–126)
Anion gap: 16 — ABNORMAL HIGH (ref 5–15)
BUN: 120 mg/dL — ABNORMAL HIGH (ref 8–23)
CO2: 21 mmol/L — ABNORMAL LOW (ref 22–32)
Calcium: 7.6 mg/dL — ABNORMAL LOW (ref 8.9–10.3)
Chloride: 98 mmol/L (ref 98–111)
Creatinine, Ser: 10.02 mg/dL — ABNORMAL HIGH (ref 0.61–1.24)
GFR, Estimated: 5 mL/min — ABNORMAL LOW (ref >60)
Glucose, Bld: 132 mg/dL — ABNORMAL HIGH (ref 70–99)
Potassium: 5.1 mmol/L (ref 3.5–5.1)
Sodium: 135 mmol/L (ref 135–145)
Total Bilirubin: 1.7 mg/dL — ABNORMAL HIGH (ref 0.0–1.2)
Total Protein: 5.3 g/dL — ABNORMAL LOW (ref 6.5–8.1)

## 2023-06-07 LAB — BASIC METABOLIC PANEL
Anion gap: 14 (ref 5–15)
BUN: 65 mg/dL — ABNORMAL HIGH (ref 8–23)
CO2: 24 mmol/L (ref 22–32)
Calcium: 7.6 mg/dL — ABNORMAL LOW (ref 8.9–10.3)
Chloride: 99 mmol/L (ref 98–111)
Creatinine, Ser: 6.86 mg/dL — ABNORMAL HIGH (ref 0.61–1.24)
GFR, Estimated: 7 mL/min — ABNORMAL LOW (ref >60)
Glucose, Bld: 149 mg/dL — ABNORMAL HIGH (ref 70–99)
Potassium: 4.2 mmol/L (ref 3.5–5.1)
Sodium: 137 mmol/L (ref 135–145)

## 2023-06-07 LAB — MAGNESIUM
Magnesium: 2.1 mg/dL (ref 1.7–2.4)
Magnesium: 2 mg/dL (ref 1.7–2.4)

## 2023-06-07 LAB — HEPATITIS B SURFACE ANTIBODY, QUANTITATIVE: Hep B S AB Quant (Post): 4 m[IU]/mL — ABNORMAL LOW

## 2023-06-09 ENCOUNTER — Institutional Professional Consult (permissible substitution) (HOSPITAL_COMMUNITY): Payer: Self-pay

## 2023-06-09 DIAGNOSIS — N1330 Unspecified hydronephrosis: Secondary | ICD-10-CM

## 2023-06-09 LAB — CBC WITH DIFFERENTIAL/PLATELET
Abs Immature Granulocytes: 0.23 10*3/uL — ABNORMAL HIGH (ref 0.00–0.07)
Basophils Absolute: 0 10*3/uL (ref 0.0–0.1)
Basophils Relative: 0 %
Eosinophils Absolute: 0 10*3/uL (ref 0.0–0.5)
Eosinophils Relative: 0 %
HCT: 24.2 % — ABNORMAL LOW (ref 39.0–52.0)
Hemoglobin: 8.4 g/dL — ABNORMAL LOW (ref 13.0–17.0)
Immature Granulocytes: 2 %
Lymphocytes Relative: 8 %
Lymphs Abs: 1.3 10*3/uL (ref 0.7–4.0)
MCH: 28.5 pg (ref 26.0–34.0)
MCHC: 34.7 g/dL (ref 30.0–36.0)
MCV: 82 fL (ref 80.0–100.0)
Monocytes Absolute: 1 10*3/uL (ref 0.1–1.0)
Monocytes Relative: 6 %
Neutro Abs: 12.9 10*3/uL — ABNORMAL HIGH (ref 1.7–7.7)
Neutrophils Relative %: 84 %
Platelets: 85 10*3/uL — ABNORMAL LOW (ref 150–400)
RBC: 2.95 MIL/uL — ABNORMAL LOW (ref 4.22–5.81)
RDW: 16.3 % — ABNORMAL HIGH (ref 11.5–15.5)
WBC: 15.4 10*3/uL — ABNORMAL HIGH (ref 4.0–10.5)
nRBC: 0 % (ref 0.0–0.2)

## 2023-06-09 LAB — BASIC METABOLIC PANEL
Anion gap: 15 (ref 5–15)
BUN: 86 mg/dL — ABNORMAL HIGH (ref 8–23)
CO2: 25 mmol/L (ref 22–32)
Calcium: 7.8 mg/dL — ABNORMAL LOW (ref 8.9–10.3)
Chloride: 95 mmol/L — ABNORMAL LOW (ref 98–111)
Creatinine, Ser: 8.39 mg/dL — ABNORMAL HIGH (ref 0.61–1.24)
GFR, Estimated: 6 mL/min — ABNORMAL LOW (ref >60)
Glucose, Bld: 123 mg/dL — ABNORMAL HIGH (ref 70–99)
Potassium: 4.6 mmol/L (ref 3.5–5.1)
Sodium: 135 mmol/L (ref 135–145)

## 2023-06-09 LAB — FIBRINOGEN: Fibrinogen: 607 mg/dL — ABNORMAL HIGH (ref 210–475)

## 2023-06-09 LAB — PROTIME-INR
INR: 1.3 — ABNORMAL HIGH (ref 0.8–1.2)
Prothrombin Time: 16.1 s — ABNORMAL HIGH (ref 11.4–15.2)

## 2023-06-09 NOTE — Progress Notes (Addendum)
 Overnight cross coverage                                        [facility]  Received a call from radiology, Dr. Beatriz, regarding abnormal CT scan findings for a patient at [facility], on 5 E, not a [facility] patient.  The patient was noted to have right hydronephrosis on renal ultrasound which led to a CT abdomen and pelvis without contrast.  The CT scan revealed the following findings:  1. 18 x 7.0 x 6.4 cm right psoas hemorrhage with small volume of free hemorrhage posterior to this tracking along the posterior right pararenal space as far as the right iliac fossa. 2. Foley catheter balloon inflated in the bulbous urethra, needs to be repositioned. 3. Moderate hydroureteronephrosis all the way to the bladder, with moderate to severe bladder dilatation, most likely due to bladder outlet obstruction related to prostatomegaly. Partly could be due to inappropriate Foley catheter placement. 4. 5.4 cm left posterolateral bladder diverticulum containing a few tiny layering stones. Small amount of layering debris in the bladder proper. Correlate with urinalysis findings. 5. Interval weight loss. Clinical correlation for potential malnutrition. 6. Trace pleural effusions, trace ascites, and body wall anasarca. 7. Cardiomegaly. 8. Gallbladder sludge versus noncalcified stones. 9. Emphysema and bronchitis. 10. NGT in the distal stomach. Mild gastric dilatation with air and fluid.  Presented at [facility] and informed the charge nurse, Mr. Gustavo, of these abnormal findings, particularly the Foley catheter balloon inflated in the bulbous urethra.  Mr. Gustavo acknowledged that this will be addressed promptly.    Called and discussed abnormal CT findings with on-call NP Vicky.  The patient will likely benefit from urology's evaluation.   No charge note.

## 2023-06-10 LAB — COMPREHENSIVE METABOLIC PANEL
ALT: 93 U/L — ABNORMAL HIGH (ref 0–44)
AST: 191 U/L — ABNORMAL HIGH (ref 15–41)
Albumin: 1.7 g/dL — ABNORMAL LOW (ref 3.5–5.0)
Alkaline Phosphatase: 175 U/L — ABNORMAL HIGH (ref 38–126)
Anion gap: 16 — ABNORMAL HIGH (ref 5–15)
BUN: 99 mg/dL — ABNORMAL HIGH (ref 8–23)
CO2: 23 mmol/L (ref 22–32)
Calcium: 7.7 mg/dL — ABNORMAL LOW (ref 8.9–10.3)
Chloride: 92 mmol/L — ABNORMAL LOW (ref 98–111)
Creatinine, Ser: 8.66 mg/dL — ABNORMAL HIGH (ref 0.61–1.24)
GFR, Estimated: 6 mL/min — ABNORMAL LOW (ref >60)
Glucose, Bld: 120 mg/dL — ABNORMAL HIGH (ref 70–99)
Potassium: 4.6 mmol/L (ref 3.5–5.1)
Sodium: 131 mmol/L — ABNORMAL LOW (ref 135–145)
Total Bilirubin: 0.8 mg/dL (ref 0.0–1.2)
Total Protein: 5.6 g/dL — ABNORMAL LOW (ref 6.5–8.1)

## 2023-06-10 LAB — HEPARIN INDUCED PLATELET AB (HIT ANTIBODY): Heparin Induced Plt Ab: 0.464 {OD_unit} — ABNORMAL HIGH (ref 0.000–0.400)

## 2023-06-10 LAB — CBC WITH DIFFERENTIAL/PLATELET
Abs Immature Granulocytes: 0.26 10*3/uL — ABNORMAL HIGH (ref 0.00–0.07)
Basophils Absolute: 0 10*3/uL (ref 0.0–0.1)
Basophils Relative: 0 %
Eosinophils Absolute: 0 10*3/uL (ref 0.0–0.5)
Eosinophils Relative: 0 %
HCT: 23.4 % — ABNORMAL LOW (ref 39.0–52.0)
Hemoglobin: 8.2 g/dL — ABNORMAL LOW (ref 13.0–17.0)
Immature Granulocytes: 2 %
Lymphocytes Relative: 10 %
Lymphs Abs: 1.5 10*3/uL (ref 0.7–4.0)
MCH: 28.5 pg (ref 26.0–34.0)
MCHC: 35 g/dL (ref 30.0–36.0)
MCV: 81.3 fL (ref 80.0–100.0)
Monocytes Absolute: 1.1 10*3/uL — ABNORMAL HIGH (ref 0.1–1.0)
Monocytes Relative: 7 %
Neutro Abs: 11.8 10*3/uL — ABNORMAL HIGH (ref 1.7–7.7)
Neutrophils Relative %: 81 %
Platelets: 101 10*3/uL — ABNORMAL LOW (ref 150–400)
RBC: 2.88 MIL/uL — ABNORMAL LOW (ref 4.22–5.81)
RDW: 16.1 % — ABNORMAL HIGH (ref 11.5–15.5)
WBC: 14.6 10*3/uL — ABNORMAL HIGH (ref 4.0–10.5)
nRBC: 0 % (ref 0.0–0.2)

## 2023-06-11 LAB — BASIC METABOLIC PANEL
Anion gap: 13 (ref 5–15)
BUN: 57 mg/dL — ABNORMAL HIGH (ref 8–23)
CO2: 26 mmol/L (ref 22–32)
Calcium: 7.9 mg/dL — ABNORMAL LOW (ref 8.9–10.3)
Chloride: 96 mmol/L — ABNORMAL LOW (ref 98–111)
Creatinine, Ser: 5.69 mg/dL — ABNORMAL HIGH (ref 0.61–1.24)
GFR, Estimated: 9 mL/min — ABNORMAL LOW (ref >60)
Glucose, Bld: 149 mg/dL — ABNORMAL HIGH (ref 70–99)
Potassium: 3.7 mmol/L (ref 3.5–5.1)
Sodium: 135 mmol/L (ref 135–145)

## 2023-06-11 LAB — MAGNESIUM: Magnesium: 2.1 mg/dL (ref 1.7–2.4)

## 2023-06-13 LAB — CULTURE, BLOOD (SINGLE): Culture: NO GROWTH

## 2023-06-15 ENCOUNTER — Other Ambulatory Visit (HOSPITAL_COMMUNITY): Payer: Self-pay

## 2023-06-15 LAB — COMPREHENSIVE METABOLIC PANEL
ALT: 38 U/L (ref 0–44)
AST: 67 U/L — ABNORMAL HIGH (ref 15–41)
Albumin: 1.6 g/dL — ABNORMAL LOW (ref 3.5–5.0)
Alkaline Phosphatase: 130 U/L — ABNORMAL HIGH (ref 38–126)
Anion gap: 14 (ref 5–15)
BUN: 70 mg/dL — ABNORMAL HIGH (ref 8–23)
CO2: 25 mmol/L (ref 22–32)
Calcium: 8 mg/dL — ABNORMAL LOW (ref 8.9–10.3)
Chloride: 98 mmol/L (ref 98–111)
Creatinine, Ser: 5.45 mg/dL — ABNORMAL HIGH (ref 0.61–1.24)
GFR, Estimated: 10 mL/min — ABNORMAL LOW (ref >60)
Glucose, Bld: 116 mg/dL — ABNORMAL HIGH (ref 70–99)
Potassium: 4.9 mmol/L (ref 3.5–5.1)
Sodium: 137 mmol/L (ref 135–145)
Total Bilirubin: 1.4 mg/dL — ABNORMAL HIGH (ref 0.0–1.2)
Total Protein: 5.9 g/dL — ABNORMAL LOW (ref 6.5–8.1)

## 2023-06-15 LAB — CBC
HCT: 19 % — ABNORMAL LOW (ref 39.0–52.0)
Hemoglobin: 6.3 g/dL — CL (ref 13.0–17.0)
MCH: 27.9 pg (ref 26.0–34.0)
MCHC: 33.2 g/dL (ref 30.0–36.0)
MCV: 84.1 fL (ref 80.0–100.0)
Platelets: 430 10*3/uL — ABNORMAL HIGH (ref 150–400)
RBC: 2.26 MIL/uL — ABNORMAL LOW (ref 4.22–5.81)
RDW: 16.4 % — ABNORMAL HIGH (ref 11.5–15.5)
WBC: 11.8 10*3/uL — ABNORMAL HIGH (ref 4.0–10.5)
nRBC: 0 % (ref 0.0–0.2)

## 2023-06-15 LAB — HEMOGLOBIN AND HEMATOCRIT, BLOOD
HCT: 17.3 % — ABNORMAL LOW (ref 39.0–52.0)
Hemoglobin: 5.8 g/dL — CL (ref 13.0–17.0)

## 2023-06-15 LAB — MAGNESIUM: Magnesium: 2.2 mg/dL (ref 1.7–2.4)

## 2023-06-16 LAB — RENAL FUNCTION PANEL
Albumin: 1.5 g/dL — ABNORMAL LOW (ref 3.5–5.0)
Anion gap: 14 (ref 5–15)
BUN: 88 mg/dL — ABNORMAL HIGH (ref 8–23)
CO2: 22 mmol/L (ref 22–32)
Calcium: 7.9 mg/dL — ABNORMAL LOW (ref 8.9–10.3)
Chloride: 98 mmol/L (ref 98–111)
Creatinine, Ser: 6.29 mg/dL — ABNORMAL HIGH (ref 0.61–1.24)
GFR, Estimated: 8 mL/min — ABNORMAL LOW (ref >60)
Glucose, Bld: 110 mg/dL — ABNORMAL HIGH (ref 70–99)
Phosphorus: 4.8 mg/dL — ABNORMAL HIGH (ref 2.5–4.6)
Potassium: 4.4 mmol/L (ref 3.5–5.1)
Sodium: 134 mmol/L — ABNORMAL LOW (ref 135–145)

## 2023-06-16 LAB — IRON AND TIBC
Iron: 12 ug/dL — ABNORMAL LOW (ref 45–182)
Saturation Ratios: 7 % — ABNORMAL LOW (ref 17.9–39.5)
TIBC: 168 ug/dL — ABNORMAL LOW (ref 250–450)
UIBC: 156 ug/dL

## 2023-06-16 LAB — CBC WITH DIFFERENTIAL/PLATELET
Abs Immature Granulocytes: 0.15 10*3/uL — ABNORMAL HIGH (ref 0.00–0.07)
Basophils Absolute: 0 10*3/uL (ref 0.0–0.1)
Basophils Relative: 0 %
Eosinophils Absolute: 0.1 10*3/uL (ref 0.0–0.5)
Eosinophils Relative: 1 %
HCT: 17.3 % — ABNORMAL LOW (ref 39.0–52.0)
Hemoglobin: 5.8 g/dL — CL (ref 13.0–17.0)
Immature Granulocytes: 1 %
Lymphocytes Relative: 12 %
Lymphs Abs: 1.5 10*3/uL (ref 0.7–4.0)
MCH: 27.6 pg (ref 26.0–34.0)
MCHC: 33.5 g/dL (ref 30.0–36.0)
MCV: 82.4 fL (ref 80.0–100.0)
Monocytes Absolute: 1.6 10*3/uL — ABNORMAL HIGH (ref 0.1–1.0)
Monocytes Relative: 13 %
Neutro Abs: 9.1 10*3/uL — ABNORMAL HIGH (ref 1.7–7.7)
Neutrophils Relative %: 73 %
Platelets: 420 10*3/uL — ABNORMAL HIGH (ref 150–400)
RBC: 2.1 MIL/uL — ABNORMAL LOW (ref 4.22–5.81)
RDW: 16.1 % — ABNORMAL HIGH (ref 11.5–15.5)
WBC: 12.4 10*3/uL — ABNORMAL HIGH (ref 4.0–10.5)
nRBC: 0 % (ref 0.0–0.2)

## 2023-06-16 LAB — FERRITIN: Ferritin: 825 ng/mL — ABNORMAL HIGH (ref 24–336)

## 2023-06-16 LAB — OCCULT BLOOD X 1 CARD TO LAB, STOOL: Fecal Occult Bld: POSITIVE — AB

## 2023-06-16 LAB — FOLATE: Folate: 16.5 ng/mL (ref >5.9)

## 2023-06-16 LAB — VITAMIN B12: Vitamin B-12: 1177 pg/mL — ABNORMAL HIGH (ref 180–914)

## 2023-06-17 LAB — HEPATITIS B SURFACE ANTIGEN: Hepatitis B Surface Ag: NONREACTIVE

## 2023-06-19 LAB — CBC WITH DIFFERENTIAL/PLATELET
Abs Immature Granulocytes: 0.09 10*3/uL — ABNORMAL HIGH (ref 0.00–0.07)
Basophils Absolute: 0 10*3/uL (ref 0.0–0.1)
Basophils Relative: 0 %
Eosinophils Absolute: 0 10*3/uL (ref 0.0–0.5)
Eosinophils Relative: 0 %
HCT: 20 % — ABNORMAL LOW (ref 39.0–52.0)
Hemoglobin: 6.7 g/dL — CL (ref 13.0–17.0)
Immature Granulocytes: 1 %
Lymphocytes Relative: 11 %
Lymphs Abs: 1.4 10*3/uL (ref 0.7–4.0)
MCH: 27.9 pg (ref 26.0–34.0)
MCHC: 33.5 g/dL (ref 30.0–36.0)
MCV: 83.3 fL (ref 80.0–100.0)
Monocytes Absolute: 0.9 10*3/uL (ref 0.1–1.0)
Monocytes Relative: 7 %
Neutro Abs: 10.2 10*3/uL — ABNORMAL HIGH (ref 1.7–7.7)
Neutrophils Relative %: 81 %
Platelets: 562 10*3/uL — ABNORMAL HIGH (ref 150–400)
RBC: 2.4 MIL/uL — ABNORMAL LOW (ref 4.22–5.81)
RDW: 16.2 % — ABNORMAL HIGH (ref 11.5–15.5)
WBC: 12.5 10*3/uL — ABNORMAL HIGH (ref 4.0–10.5)
nRBC: 0 % (ref 0.0–0.2)

## 2023-06-19 LAB — RENAL FUNCTION PANEL
Albumin: 1.7 g/dL — ABNORMAL LOW (ref 3.5–5.0)
Anion gap: 11 (ref 5–15)
BUN: 104 mg/dL — ABNORMAL HIGH (ref 8–23)
CO2: 26 mmol/L (ref 22–32)
Calcium: 8.4 mg/dL — ABNORMAL LOW (ref 8.9–10.3)
Chloride: 97 mmol/L — ABNORMAL LOW (ref 98–111)
Creatinine, Ser: 6.8 mg/dL — ABNORMAL HIGH (ref 0.61–1.24)
GFR, Estimated: 7 mL/min — ABNORMAL LOW (ref >60)
Glucose, Bld: 150 mg/dL — ABNORMAL HIGH (ref 70–99)
Phosphorus: 5 mg/dL — ABNORMAL HIGH (ref 2.5–4.6)
Potassium: 4.6 mmol/L (ref 3.5–5.1)
Sodium: 134 mmol/L — ABNORMAL LOW (ref 135–145)

## 2023-06-21 LAB — CBC WITH DIFFERENTIAL/PLATELET
Abs Immature Granulocytes: 0.1 10*3/uL — ABNORMAL HIGH (ref 0.00–0.07)
Basophils Absolute: 0.1 10*3/uL (ref 0.0–0.1)
Basophils Relative: 0 %
Eosinophils Absolute: 0.2 10*3/uL (ref 0.0–0.5)
Eosinophils Relative: 1 %
HCT: 21.5 % — ABNORMAL LOW (ref 39.0–52.0)
Hemoglobin: 7 g/dL — ABNORMAL LOW (ref 13.0–17.0)
Immature Granulocytes: 1 %
Lymphocytes Relative: 13 %
Lymphs Abs: 1.6 10*3/uL (ref 0.7–4.0)
MCH: 27.5 pg (ref 26.0–34.0)
MCHC: 32.6 g/dL (ref 30.0–36.0)
MCV: 84.3 fL (ref 80.0–100.0)
Monocytes Absolute: 1.4 10*3/uL — ABNORMAL HIGH (ref 0.1–1.0)
Monocytes Relative: 11 %
Neutro Abs: 9.4 10*3/uL — ABNORMAL HIGH (ref 1.7–7.7)
Neutrophils Relative %: 74 %
Platelets: 538 10*3/uL — ABNORMAL HIGH (ref 150–400)
RBC: 2.55 MIL/uL — ABNORMAL LOW (ref 4.22–5.81)
RDW: 16.6 % — ABNORMAL HIGH (ref 11.5–15.5)
WBC: 12.7 10*3/uL — ABNORMAL HIGH (ref 4.0–10.5)
nRBC: 0.2 % (ref 0.0–0.2)

## 2023-06-21 LAB — RENAL FUNCTION PANEL
Albumin: 1.7 g/dL — ABNORMAL LOW (ref 3.5–5.0)
Anion gap: 11 (ref 5–15)
BUN: 88 mg/dL — ABNORMAL HIGH (ref 8–23)
CO2: 26 mmol/L (ref 22–32)
Calcium: 8 mg/dL — ABNORMAL LOW (ref 8.9–10.3)
Chloride: 95 mmol/L — ABNORMAL LOW (ref 98–111)
Creatinine, Ser: 5.43 mg/dL — ABNORMAL HIGH (ref 0.61–1.24)
GFR, Estimated: 10 mL/min — ABNORMAL LOW (ref >60)
Glucose, Bld: 136 mg/dL — ABNORMAL HIGH (ref 70–99)
Phosphorus: 4.1 mg/dL (ref 2.5–4.6)
Potassium: 3.8 mmol/L (ref 3.5–5.1)
Sodium: 132 mmol/L — ABNORMAL LOW (ref 135–145)

## 2023-06-23 ENCOUNTER — Other Ambulatory Visit (HOSPITAL_COMMUNITY): Payer: Self-pay

## 2023-06-23 LAB — RENAL FUNCTION PANEL
Albumin: 1.7 g/dL — ABNORMAL LOW (ref 3.5–5.0)
Anion gap: 8 (ref 5–15)
BUN: 66 mg/dL — ABNORMAL HIGH (ref 8–23)
CO2: 29 mmol/L (ref 22–32)
Calcium: 8.3 mg/dL — ABNORMAL LOW (ref 8.9–10.3)
Chloride: 102 mmol/L (ref 98–111)
Creatinine, Ser: 4.36 mg/dL — ABNORMAL HIGH (ref 0.61–1.24)
GFR, Estimated: 13 mL/min — ABNORMAL LOW (ref >60)
Glucose, Bld: 106 mg/dL — ABNORMAL HIGH (ref 70–99)
Phosphorus: 3.6 mg/dL (ref 2.5–4.6)
Potassium: 4.2 mmol/L (ref 3.5–5.1)
Sodium: 139 mmol/L (ref 135–145)

## 2023-06-23 LAB — CBC WITH DIFFERENTIAL/PLATELET
Abs Immature Granulocytes: 0.07 10*3/uL (ref 0.00–0.07)
Basophils Absolute: 0 10*3/uL (ref 0.0–0.1)
Basophils Relative: 0 %
Eosinophils Absolute: 0.2 10*3/uL (ref 0.0–0.5)
Eosinophils Relative: 2 %
HCT: 22.8 % — ABNORMAL LOW (ref 39.0–52.0)
Hemoglobin: 7.1 g/dL — ABNORMAL LOW (ref 13.0–17.0)
Immature Granulocytes: 1 %
Lymphocytes Relative: 15 %
Lymphs Abs: 1.5 10*3/uL (ref 0.7–4.0)
MCH: 26.5 pg (ref 26.0–34.0)
MCHC: 31.1 g/dL (ref 30.0–36.0)
MCV: 85.1 fL (ref 80.0–100.0)
Monocytes Absolute: 1.2 10*3/uL — ABNORMAL HIGH (ref 0.1–1.0)
Monocytes Relative: 12 %
Neutro Abs: 7 10*3/uL (ref 1.7–7.7)
Neutrophils Relative %: 70 %
Platelets: 437 10*3/uL — ABNORMAL HIGH (ref 150–400)
RBC: 2.68 MIL/uL — ABNORMAL LOW (ref 4.22–5.81)
RDW: 17.1 % — ABNORMAL HIGH (ref 11.5–15.5)
WBC: 10.1 10*3/uL (ref 4.0–10.5)
nRBC: 0 % (ref 0.0–0.2)

## 2023-06-26 LAB — CBC WITH DIFFERENTIAL/PLATELET
Abs Immature Granulocytes: 0.07 10*3/uL (ref 0.00–0.07)
Basophils Absolute: 0 10*3/uL (ref 0.0–0.1)
Basophils Relative: 0 %
Eosinophils Absolute: 0.2 10*3/uL (ref 0.0–0.5)
Eosinophils Relative: 2 %
HCT: 22.6 % — ABNORMAL LOW (ref 39.0–52.0)
Hemoglobin: 7.1 g/dL — ABNORMAL LOW (ref 13.0–17.0)
Immature Granulocytes: 1 %
Lymphocytes Relative: 17 %
Lymphs Abs: 1.5 10*3/uL (ref 0.7–4.0)
MCH: 26.7 pg (ref 26.0–34.0)
MCHC: 31.4 g/dL (ref 30.0–36.0)
MCV: 85 fL (ref 80.0–100.0)
Monocytes Absolute: 1.1 10*3/uL — ABNORMAL HIGH (ref 0.1–1.0)
Monocytes Relative: 12 %
Neutro Abs: 5.9 10*3/uL (ref 1.7–7.7)
Neutrophils Relative %: 68 %
Platelets: 380 10*3/uL (ref 150–400)
RBC: 2.66 MIL/uL — ABNORMAL LOW (ref 4.22–5.81)
RDW: 16.9 % — ABNORMAL HIGH (ref 11.5–15.5)
WBC: 8.8 10*3/uL (ref 4.0–10.5)
nRBC: 0 % (ref 0.0–0.2)

## 2023-06-26 LAB — RENAL FUNCTION PANEL
Albumin: 1.8 g/dL — ABNORMAL LOW (ref 3.5–5.0)
Anion gap: 11 (ref 5–15)
BUN: 72 mg/dL — ABNORMAL HIGH (ref 8–23)
CO2: 26 mmol/L (ref 22–32)
Calcium: 8.5 mg/dL — ABNORMAL LOW (ref 8.9–10.3)
Chloride: 103 mmol/L (ref 98–111)
Creatinine, Ser: 4.56 mg/dL — ABNORMAL HIGH (ref 0.61–1.24)
GFR, Estimated: 12 mL/min — ABNORMAL LOW (ref >60)
Glucose, Bld: 127 mg/dL — ABNORMAL HIGH (ref 70–99)
Phosphorus: 3.7 mg/dL (ref 2.5–4.6)
Potassium: 3.8 mmol/L (ref 3.5–5.1)
Sodium: 140 mmol/L (ref 135–145)

## 2023-06-27 LAB — FERRITIN: Ferritin: 511 ng/mL — ABNORMAL HIGH (ref 24–336)
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
## 2023-06-28 ENCOUNTER — Other Ambulatory Visit (HOSPITAL_COMMUNITY): Payer: Self-pay

## 2023-06-28 HISTORY — PX: IR GASTROSTOMY TUBE MOD SED: IMG625

## 2023-06-28 LAB — RENAL FUNCTION PANEL
Albumin: 2 g/dL — ABNORMAL LOW (ref 3.5–5.0)
Anion gap: 10 (ref 5–15)
BUN: 50 mg/dL — ABNORMAL HIGH (ref 8–23)
CO2: 27 mmol/L (ref 22–32)
Calcium: 8.6 mg/dL — ABNORMAL LOW (ref 8.9–10.3)
Chloride: 103 mmol/L (ref 98–111)
Creatinine, Ser: 3.2 mg/dL — ABNORMAL HIGH (ref 0.61–1.24)
GFR, Estimated: 18 mL/min — ABNORMAL LOW (ref >60)
Glucose, Bld: 109 mg/dL — ABNORMAL HIGH (ref 70–99)
Phosphorus: 3.2 mg/dL (ref 2.5–4.6)
Potassium: 3.7 mmol/L (ref 3.5–5.1)
Sodium: 140 mmol/L (ref 135–145)

## 2023-06-28 LAB — CBC WITH DIFFERENTIAL/PLATELET
Abs Immature Granulocytes: 0.05 10*3/uL (ref 0.00–0.07)
Basophils Absolute: 0 10*3/uL (ref 0.0–0.1)
Basophils Relative: 0 %
Eosinophils Absolute: 0.1 10*3/uL (ref 0.0–0.5)
Eosinophils Relative: 1 %
HCT: 27.2 % — ABNORMAL LOW (ref 39.0–52.0)
Hemoglobin: 8.7 g/dL — ABNORMAL LOW (ref 13.0–17.0)
Immature Granulocytes: 1 %
Lymphocytes Relative: 13 %
Lymphs Abs: 1.3 10*3/uL (ref 0.7–4.0)
MCH: 27.4 pg (ref 26.0–34.0)
MCHC: 32 g/dL (ref 30.0–36.0)
MCV: 85.8 fL (ref 80.0–100.0)
Monocytes Absolute: 1.4 10*3/uL — ABNORMAL HIGH (ref 0.1–1.0)
Monocytes Relative: 13 %
Neutro Abs: 7.5 10*3/uL (ref 1.7–7.7)
Neutrophils Relative %: 72 %
Platelets: 369 10*3/uL (ref 150–400)
RBC: 3.17 MIL/uL — ABNORMAL LOW (ref 4.22–5.81)
RDW: 16.8 % — ABNORMAL HIGH (ref 11.5–15.5)
WBC: 10.3 10*3/uL (ref 4.0–10.5)
nRBC: 0 % (ref 0.0–0.2)

## 2023-06-28 MED ORDER — MIDAZOLAM HCL 2 MG/2ML IJ SOLN
INTRAMUSCULAR | Status: AC | PRN
  Administered 2023-06-28: .5 mg via INTRAVENOUS

## 2023-06-28 MED ORDER — FENTANYL CITRATE (PF) 100 MCG/2ML IJ SOLN
INTRAMUSCULAR | Status: AC | PRN
  Administered 2023-06-28: 25 ug via INTRAVENOUS

## 2023-06-28 MED ORDER — IOHEXOL 300 MG/ML  SOLN
50.0000 mL | Freq: Once | INTRAMUSCULAR | Status: AC | PRN
  Administered 2023-06-28: 20 mL

## 2023-06-28 MED ORDER — CEFAZOLIN SODIUM-DEXTROSE 2-4 GM/100ML-% IV SOLN
INTRAVENOUS | Status: AC | PRN
  Administered 2023-06-28: 2 g via INTRAVENOUS

## 2023-06-28 MED ORDER — IOHEXOL 300 MG/ML  SOLN: 50.0000 mL | Freq: Once | INTRAMUSCULAR | Status: DC | PRN

## 2023-06-28 NOTE — Procedures (Signed)
Interventional Radiology Procedure Note  Procedure: Placement of percutaneous 50F balloon-retention gastrostomy tube.  Complications: None  EBL:  < 5 mL  Recommendations: - NPO for 4 hours after placement - Maintain G-tube to low wall suction for 4 hours after placement - May advance diet as tolerated and begin using tube 4 hours after placement - Gastropexy sutures are absorbable and do not require removal  Marliss Coots, MD Pager: [phone]

## 2023-06-30 LAB — CBC WITH DIFFERENTIAL/PLATELET
Abs Immature Granulocytes: 0.05 10*3/uL (ref 0.00–0.07)
Basophils Absolute: 0.1 10*3/uL (ref 0.0–0.1)
Basophils Relative: 1 %
Eosinophils Absolute: 0.1 10*3/uL (ref 0.0–0.5)
Eosinophils Relative: 1 %
HCT: 23.8 % — ABNORMAL LOW (ref 39.0–52.0)
Hemoglobin: 7.7 g/dL — ABNORMAL LOW (ref 13.0–17.0)
Immature Granulocytes: 1 %
Lymphocytes Relative: 13 %
Lymphs Abs: 1.3 10*3/uL (ref 0.7–4.0)
MCH: 27.4 pg (ref 26.0–34.0)
MCHC: 32.4 g/dL (ref 30.0–36.0)
MCV: 84.7 fL (ref 80.0–100.0)
Monocytes Absolute: 1.3 10*3/uL — ABNORMAL HIGH (ref 0.1–1.0)
Monocytes Relative: 13 %
Neutro Abs: 7.1 10*3/uL (ref 1.7–7.7)
Neutrophils Relative %: 71 %
Platelets: 359 10*3/uL (ref 150–400)
RBC: 2.81 MIL/uL — ABNORMAL LOW (ref 4.22–5.81)
RDW: 16.3 % — ABNORMAL HIGH (ref 11.5–15.5)
WBC: 9.8 10*3/uL (ref 4.0–10.5)
nRBC: 0 % (ref 0.0–0.2)

## 2023-06-30 LAB — RENAL FUNCTION PANEL
Albumin: 1.7 g/dL — ABNORMAL LOW (ref 3.5–5.0)
Anion gap: 13 (ref 5–15)
BUN: 44 mg/dL — ABNORMAL HIGH (ref 8–23)
CO2: 26 mmol/L (ref 22–32)
Calcium: 8.3 mg/dL — ABNORMAL LOW (ref 8.9–10.3)
Chloride: 101 mmol/L (ref 98–111)
Creatinine, Ser: 2.88 mg/dL — ABNORMAL HIGH (ref 0.61–1.24)
GFR, Estimated: 21 mL/min — ABNORMAL LOW (ref >60)
Glucose, Bld: 141 mg/dL — ABNORMAL HIGH (ref 70–99)
Phosphorus: 3.5 mg/dL (ref 2.5–4.6)
Potassium: 3.4 mmol/L — ABNORMAL LOW (ref 3.5–5.1)
Sodium: 140 mmol/L (ref 135–145)

## 2023-07-01 LAB — POTASSIUM: Potassium: 3.7 mmol/L (ref 3.5–5.1)

## 2023-07-02 LAB — BASIC METABOLIC PANEL
Anion gap: 9 (ref 5–15)
BUN: 71 mg/dL — ABNORMAL HIGH (ref 8–23)
CO2: 27 mmol/L (ref 22–32)
Calcium: 8.4 mg/dL — ABNORMAL LOW (ref 8.9–10.3)
Chloride: 106 mmol/L (ref 98–111)
Creatinine, Ser: 3.77 mg/dL — ABNORMAL HIGH (ref 0.61–1.24)
GFR, Estimated: 15 mL/min — ABNORMAL LOW (ref >60)
Glucose, Bld: 121 mg/dL — ABNORMAL HIGH (ref 70–99)
Potassium: 3.5 mmol/L (ref 3.5–5.1)
Sodium: 142 mmol/L (ref 135–145)

## 2023-07-02 LAB — MAGNESIUM: Magnesium: 2.3 mg/dL (ref 1.7–2.4)

## 2023-07-02 NOTE — Progress Notes (Addendum)
   IR Note  IR placed non tunneled dialysis catheter  06/04/23 Pt with ongoing needs for continued dialysis per Dr George Ina  Alert Answers all questions correctly Rt internal jugular non tunneled dialyisis catheter in place Heart: RRR Lungs: CTA Abd: soft; NT Moves all 4s and follows commands  Unable to grasp pen and sign consent on own Consented wife Aydeen Blume via phone   Acute kidney injury/chronic kidney disease stage IIIA baseline EGFR 59/bilateral hydronephrosis.  Renal ultrasound reviewed.  It appears that there is some underlying hydronephrosis.  It appears that Foley catheter has been repositioned.  Update:  Case discussed with primary team.  Patient will need PermCath to be placed.  Consult IR for this purpose.  Next dialysis treatment scheduled for tomorrow.  Risks and benefits discussed with the patient's wife Trayon Krantz via phone including, but not limited to bleeding, infection, vascular injury, pneumothorax which may require chest tube placement, air embolism or even death  All questions were answered, wife is agreeable to proceed. Consent signed and in chart.

## 2023-07-03 ENCOUNTER — Other Ambulatory Visit (HOSPITAL_COMMUNITY): Payer: Self-pay

## 2023-07-03 HISTORY — PX: IR FLUORO GUIDE CV LINE RIGHT: IMG2283

## 2023-07-03 LAB — CBC WITH DIFFERENTIAL/PLATELET
Abs Immature Granulocytes: 0.07 10*3/uL (ref 0.00–0.07)
Basophils Absolute: 0 10*3/uL (ref 0.0–0.1)
Basophils Relative: 0 %
Eosinophils Absolute: 0.2 10*3/uL (ref 0.0–0.5)
Eosinophils Relative: 1 %
HCT: 24 % — ABNORMAL LOW (ref 39.0–52.0)
Hemoglobin: 7.6 g/dL — ABNORMAL LOW (ref 13.0–17.0)
Immature Granulocytes: 1 %
Lymphocytes Relative: 13 %
Lymphs Abs: 1.5 10*3/uL (ref 0.7–4.0)
MCH: 26.8 pg (ref 26.0–34.0)
MCHC: 31.7 g/dL (ref 30.0–36.0)
MCV: 84.5 fL (ref 80.0–100.0)
Monocytes Absolute: 1.1 10*3/uL — ABNORMAL HIGH (ref 0.1–1.0)
Monocytes Relative: 10 %
Neutro Abs: 8.6 10*3/uL — ABNORMAL HIGH (ref 1.7–7.7)
Neutrophils Relative %: 75 %
Platelets: 327 10*3/uL (ref 150–400)
RBC: 2.84 MIL/uL — ABNORMAL LOW (ref 4.22–5.81)
RDW: 16.3 % — ABNORMAL HIGH (ref 11.5–15.5)
WBC: 11.4 10*3/uL — ABNORMAL HIGH (ref 4.0–10.5)
nRBC: 0 % (ref 0.0–0.2)

## 2023-07-03 LAB — RENAL FUNCTION PANEL
Albumin: 1.9 g/dL — ABNORMAL LOW (ref 3.5–5.0)
Anion gap: 11 (ref 5–15)
BUN: 75 mg/dL — ABNORMAL HIGH (ref 8–23)
CO2: 26 mmol/L (ref 22–32)
Calcium: 8.5 mg/dL — ABNORMAL LOW (ref 8.9–10.3)
Chloride: 104 mmol/L (ref 98–111)
Creatinine, Ser: 3.83 mg/dL — ABNORMAL HIGH (ref 0.61–1.24)
GFR, Estimated: 15 mL/min — ABNORMAL LOW (ref >60)
Glucose, Bld: 113 mg/dL — ABNORMAL HIGH (ref 70–99)
Phosphorus: 2.7 mg/dL (ref 2.5–4.6)
Potassium: 3.5 mmol/L (ref 3.5–5.1)
Sodium: 141 mmol/L (ref 135–145)

## 2023-07-03 MED ORDER — MIDAZOLAM HCL 2 MG/2ML IJ SOLN
INTRAMUSCULAR | Status: AC | PRN
  Administered 2023-07-03: .5 mg via INTRAVENOUS

## 2023-07-03 MED ORDER — LIDOCAINE-EPINEPHRINE 1 %-1:100000 IJ SOLN
20.0000 mL | Freq: Once | INTRAMUSCULAR | Status: AC
  Administered 2023-07-03: 17 mL via INTRADERMAL

## 2023-07-03 MED ORDER — CEFAZOLIN SODIUM-DEXTROSE 1-4 GM/50ML-% IV SOLN
INTRAVENOUS | Status: AC | PRN
  Administered 2023-07-03: 2 g via INTRAVENOUS

## 2023-07-03 MED ORDER — FENTANYL CITRATE (PF) 100 MCG/2ML IJ SOLN
INTRAMUSCULAR | Status: AC | PRN
  Administered 2023-07-03: 25 ug via INTRAVENOUS

## 2023-07-03 MED ORDER — HEPARIN SODIUM (PORCINE) 1000 UNIT/ML IJ SOLN
10000.0000 [IU] | Freq: Once | INTRAMUSCULAR | Status: AC
  Administered 2023-07-03: 3800 [IU]

## 2023-07-03 NOTE — Procedures (Signed)
 Interventional Radiology Procedure Note  Procedure: Conversion of temporary to tunneled hemodialysis catheter  Findings: Please refer to procedural dictation for full description. 23 cm tunneled HD.  Complications: None immediate  Estimated Blood Loss: < 5 ml  Recommendations: Catheter ready for immediate use.   Ester Sides, MD

## 2023-07-04 LAB — HEPATITIS C ANTIBODY: HCV Ab: NONREACTIVE

## 2023-07-05 LAB — CBC WITH DIFFERENTIAL/PLATELET
Abs Immature Granulocytes: 0.03 10*3/uL (ref 0.00–0.07)
Basophils Absolute: 0 10*3/uL (ref 0.0–0.1)
Basophils Relative: 0 %
Eosinophils Absolute: 0.2 10*3/uL (ref 0.0–0.5)
Eosinophils Relative: 2 %
HCT: 27.1 % — ABNORMAL LOW (ref 39.0–52.0)
Hemoglobin: 8.6 g/dL — ABNORMAL LOW (ref 13.0–17.0)
Immature Granulocytes: 0 %
Lymphocytes Relative: 16 %
Lymphs Abs: 1.3 10*3/uL (ref 0.7–4.0)
MCH: 27 pg (ref 26.0–34.0)
MCHC: 31.7 g/dL (ref 30.0–36.0)
MCV: 85.2 fL (ref 80.0–100.0)
Monocytes Absolute: 0.8 10*3/uL (ref 0.1–1.0)
Monocytes Relative: 10 %
Neutro Abs: 6 10*3/uL (ref 1.7–7.7)
Neutrophils Relative %: 72 %
Platelets: 233 10*3/uL (ref 150–400)
RBC: 3.18 MIL/uL — ABNORMAL LOW (ref 4.22–5.81)
RDW: 16.4 % — ABNORMAL HIGH (ref 11.5–15.5)
WBC: 8.4 10*3/uL (ref 4.0–10.5)
nRBC: 0 % (ref 0.0–0.2)

## 2023-07-05 LAB — RENAL FUNCTION PANEL
Albumin: 2.2 g/dL — ABNORMAL LOW (ref 3.5–5.0)
Anion gap: 15 (ref 5–15)
BUN: 55 mg/dL — ABNORMAL HIGH (ref 8–23)
CO2: 23 mmol/L (ref 22–32)
Calcium: 8.9 mg/dL (ref 8.9–10.3)
Chloride: 100 mmol/L (ref 98–111)
Creatinine, Ser: 3.27 mg/dL — ABNORMAL HIGH (ref 0.61–1.24)
GFR, Estimated: 18 mL/min — ABNORMAL LOW (ref >60)
Glucose, Bld: 122 mg/dL — ABNORMAL HIGH (ref 70–99)
Phosphorus: 2.8 mg/dL (ref 2.5–4.6)
Potassium: 3.6 mmol/L (ref 3.5–5.1)
Sodium: 138 mmol/L (ref 135–145)

## 2023-07-07 LAB — CBC WITH DIFFERENTIAL/PLATELET
Abs Immature Granulocytes: 0.05 10*3/uL (ref 0.00–0.07)
Basophils Absolute: 0 10*3/uL (ref 0.0–0.1)
Basophils Relative: 0 %
Eosinophils Absolute: 0.2 10*3/uL (ref 0.0–0.5)
Eosinophils Relative: 2 %
HCT: 26.6 % — ABNORMAL LOW (ref 39.0–52.0)
Hemoglobin: 8.3 g/dL — ABNORMAL LOW (ref 13.0–17.0)
Immature Granulocytes: 1 %
Lymphocytes Relative: 15 %
Lymphs Abs: 1.5 10*3/uL (ref 0.7–4.0)
MCH: 26.4 pg (ref 26.0–34.0)
MCHC: 31.2 g/dL (ref 30.0–36.0)
MCV: 84.7 fL (ref 80.0–100.0)
Monocytes Absolute: 1.1 10*3/uL — ABNORMAL HIGH (ref 0.1–1.0)
Monocytes Relative: 11 %
Neutro Abs: 7.6 10*3/uL (ref 1.7–7.7)
Neutrophils Relative %: 71 %
Platelets: 231 10*3/uL (ref 150–400)
RBC: 3.14 MIL/uL — ABNORMAL LOW (ref 4.22–5.81)
RDW: 16.2 % — ABNORMAL HIGH (ref 11.5–15.5)
WBC: 10.5 10*3/uL (ref 4.0–10.5)
nRBC: 0 % (ref 0.0–0.2)

## 2023-07-07 LAB — RENAL FUNCTION PANEL
Albumin: 2 g/dL — ABNORMAL LOW (ref 3.5–5.0)
Anion gap: 15 (ref 5–15)
BUN: 57 mg/dL — ABNORMAL HIGH (ref 8–23)
CO2: 26 mmol/L (ref 22–32)
Calcium: 8.9 mg/dL (ref 8.9–10.3)
Chloride: 99 mmol/L (ref 98–111)
Creatinine, Ser: 3.87 mg/dL — ABNORMAL HIGH (ref 0.61–1.24)
GFR, Estimated: 15 mL/min — ABNORMAL LOW (ref >60)
Glucose, Bld: 109 mg/dL — ABNORMAL HIGH (ref 70–99)
Phosphorus: 2.3 mg/dL — ABNORMAL LOW (ref 2.5–4.6)
Potassium: 4.3 mmol/L (ref 3.5–5.1)
Sodium: 140 mmol/L (ref 135–145)

## 2023-07-10 LAB — CBC WITH DIFFERENTIAL/PLATELET
Abs Immature Granulocytes: 0.03 10*3/uL (ref 0.00–0.07)
Basophils Absolute: 0 10*3/uL (ref 0.0–0.1)
Basophils Relative: 0 %
Eosinophils Absolute: 0.2 10*3/uL (ref 0.0–0.5)
Eosinophils Relative: 2 %
HCT: 28.3 % — ABNORMAL LOW (ref 39.0–52.0)
Hemoglobin: 8.7 g/dL — ABNORMAL LOW (ref 13.0–17.0)
Immature Granulocytes: 0 %
Lymphocytes Relative: 12 %
Lymphs Abs: 1 10*3/uL (ref 0.7–4.0)
MCH: 25.7 pg — ABNORMAL LOW (ref 26.0–34.0)
MCHC: 30.7 g/dL (ref 30.0–36.0)
MCV: 83.7 fL (ref 80.0–100.0)
Monocytes Absolute: 0.8 10*3/uL (ref 0.1–1.0)
Monocytes Relative: 9 %
Neutro Abs: 6.4 10*3/uL (ref 1.7–7.7)
Neutrophils Relative %: 77 %
Platelets: 194 10*3/uL (ref 150–400)
RBC: 3.38 MIL/uL — ABNORMAL LOW (ref 4.22–5.81)
RDW: 16.1 % — ABNORMAL HIGH (ref 11.5–15.5)
WBC: 8.4 10*3/uL (ref 4.0–10.5)
nRBC: 0 % (ref 0.0–0.2)

## 2023-07-10 LAB — RENAL FUNCTION PANEL
Albumin: 2.1 g/dL — ABNORMAL LOW (ref 3.5–5.0)
Anion gap: 16 — ABNORMAL HIGH (ref 5–15)
BUN: 75 mg/dL — ABNORMAL HIGH (ref 8–23)
CO2: 26 mmol/L (ref 22–32)
Calcium: 9.5 mg/dL (ref 8.9–10.3)
Chloride: 95 mmol/L — ABNORMAL LOW (ref 98–111)
Creatinine, Ser: 5.52 mg/dL — ABNORMAL HIGH (ref 0.61–1.24)
GFR, Estimated: 10 mL/min — ABNORMAL LOW (ref >60)
Glucose, Bld: 133 mg/dL — ABNORMAL HIGH (ref 70–99)
Phosphorus: 2.8 mg/dL (ref 2.5–4.6)
Potassium: 4.2 mmol/L (ref 3.5–5.1)
Sodium: 137 mmol/L (ref 135–145)

## 2023-07-12 LAB — CBC WITH DIFFERENTIAL/PLATELET
Abs Immature Granulocytes: 0.06 10*3/uL (ref 0.00–0.07)
Basophils Absolute: 0 10*3/uL (ref 0.0–0.1)
Basophils Relative: 0 %
Eosinophils Absolute: 0.2 10*3/uL (ref 0.0–0.5)
Eosinophils Relative: 3 %
HCT: 28.3 % — ABNORMAL LOW (ref 39.0–52.0)
Hemoglobin: 8.6 g/dL — ABNORMAL LOW (ref 13.0–17.0)
Immature Granulocytes: 1 %
Lymphocytes Relative: 10 %
Lymphs Abs: 0.9 10*3/uL (ref 0.7–4.0)
MCH: 25.7 pg — ABNORMAL LOW (ref 26.0–34.0)
MCHC: 30.4 g/dL (ref 30.0–36.0)
MCV: 84.5 fL (ref 80.0–100.0)
Monocytes Absolute: 1 10*3/uL (ref 0.1–1.0)
Monocytes Relative: 12 %
Neutro Abs: 6.4 10*3/uL (ref 1.7–7.7)
Neutrophils Relative %: 74 %
Platelets: 198 10*3/uL (ref 150–400)
RBC: 3.35 MIL/uL — ABNORMAL LOW (ref 4.22–5.81)
RDW: 16.1 % — ABNORMAL HIGH (ref 11.5–15.5)
WBC: 8.6 10*3/uL (ref 4.0–10.5)
nRBC: 0 % (ref 0.0–0.2)

## 2023-07-12 LAB — RENAL FUNCTION PANEL
Albumin: 2.1 g/dL — ABNORMAL LOW (ref 3.5–5.0)
Anion gap: 11 (ref 5–15)
BUN: 62 mg/dL — ABNORMAL HIGH (ref 8–23)
CO2: 26 mmol/L (ref 22–32)
Calcium: 8.7 mg/dL — ABNORMAL LOW (ref 8.9–10.3)
Chloride: 96 mmol/L — ABNORMAL LOW (ref 98–111)
Creatinine, Ser: 5.11 mg/dL — ABNORMAL HIGH (ref 0.61–1.24)
GFR, Estimated: 11 mL/min — ABNORMAL LOW (ref >60)
Glucose, Bld: 148 mg/dL — ABNORMAL HIGH (ref 70–99)
Phosphorus: 2 mg/dL — ABNORMAL LOW (ref 2.5–4.6)
Potassium: 4.5 mmol/L (ref 3.5–5.1)
Sodium: 133 mmol/L — ABNORMAL LOW (ref 135–145)

## 2023-07-14 LAB — CBC WITH DIFFERENTIAL/PLATELET
Abs Immature Granulocytes: 0.05 10*3/uL (ref 0.00–0.07)
Basophils Absolute: 0 10*3/uL (ref 0.0–0.1)
Basophils Relative: 0 %
Eosinophils Absolute: 0.2 10*3/uL (ref 0.0–0.5)
Eosinophils Relative: 2 %
HCT: 28.4 % — ABNORMAL LOW (ref 39.0–52.0)
Hemoglobin: 8.9 g/dL — ABNORMAL LOW (ref 13.0–17.0)
Immature Granulocytes: 1 %
Lymphocytes Relative: 12 %
Lymphs Abs: 1 10*3/uL (ref 0.7–4.0)
MCH: 25.8 pg — ABNORMAL LOW (ref 26.0–34.0)
MCHC: 31.3 g/dL (ref 30.0–36.0)
MCV: 82.3 fL (ref 80.0–100.0)
Monocytes Absolute: 1.1 10*3/uL — ABNORMAL HIGH (ref 0.1–1.0)
Monocytes Relative: 13 %
Neutro Abs: 6 10*3/uL (ref 1.7–7.7)
Neutrophils Relative %: 72 %
Platelets: 244 10*3/uL (ref 150–400)
RBC: 3.45 MIL/uL — ABNORMAL LOW (ref 4.22–5.81)
RDW: 16.1 % — ABNORMAL HIGH (ref 11.5–15.5)
WBC: 8.3 10*3/uL (ref 4.0–10.5)
nRBC: 0 % (ref 0.0–0.2)

## 2023-07-14 LAB — RENAL FUNCTION PANEL
Albumin: 2.3 g/dL — ABNORMAL LOW (ref 3.5–5.0)
Anion gap: 12 (ref 5–15)
BUN: 50 mg/dL — ABNORMAL HIGH (ref 8–23)
CO2: 26 mmol/L (ref 22–32)
Calcium: 8.9 mg/dL (ref 8.9–10.3)
Chloride: 95 mmol/L — ABNORMAL LOW (ref 98–111)
Creatinine, Ser: 4.94 mg/dL — ABNORMAL HIGH (ref 0.61–1.24)
GFR, Estimated: 11 mL/min — ABNORMAL LOW (ref >60)
Glucose, Bld: 109 mg/dL — ABNORMAL HIGH (ref 70–99)
Phosphorus: 2.5 mg/dL (ref 2.5–4.6)
Potassium: 4.8 mmol/L (ref 3.5–5.1)
Sodium: 133 mmol/L — ABNORMAL LOW (ref 135–145)

## 2023-07-16 LAB — HEPATITIS B SURFACE ANTIGEN: Hepatitis B Surface Ag: NONREACTIVE

## 2023-07-17 LAB — RENAL FUNCTION PANEL
Albumin: 2.3 g/dL — ABNORMAL LOW (ref 3.5–5.0)
Anion gap: 15 (ref 5–15)
BUN: 53 mg/dL — ABNORMAL HIGH (ref 8–23)
CO2: 25 mmol/L (ref 22–32)
Calcium: 8.8 mg/dL — ABNORMAL LOW (ref 8.9–10.3)
Chloride: 95 mmol/L — ABNORMAL LOW (ref 98–111)
Creatinine, Ser: 5.9 mg/dL — ABNORMAL HIGH (ref 0.61–1.24)
GFR, Estimated: 9 mL/min — ABNORMAL LOW (ref >60)
Glucose, Bld: 134 mg/dL — ABNORMAL HIGH (ref 70–99)
Phosphorus: 2.5 mg/dL (ref 2.5–4.6)
Potassium: 6 mmol/L — ABNORMAL HIGH (ref 3.5–5.1)
Sodium: 135 mmol/L (ref 135–145)

## 2023-07-17 LAB — CBC WITH DIFFERENTIAL/PLATELET
Abs Immature Granulocytes: 0.04 10*3/uL (ref 0.00–0.07)
Basophils Absolute: 0 10*3/uL (ref 0.0–0.1)
Basophils Relative: 0 %
Eosinophils Absolute: 0.2 10*3/uL (ref 0.0–0.5)
Eosinophils Relative: 2 %
HCT: 31.8 % — ABNORMAL LOW (ref 39.0–52.0)
Hemoglobin: 9.8 g/dL — ABNORMAL LOW (ref 13.0–17.0)
Immature Granulocytes: 0 %
Lymphocytes Relative: 9 %
Lymphs Abs: 1 10*3/uL (ref 0.7–4.0)
MCH: 25.4 pg — ABNORMAL LOW (ref 26.0–34.0)
MCHC: 30.8 g/dL (ref 30.0–36.0)
MCV: 82.4 fL (ref 80.0–100.0)
Monocytes Absolute: 1.4 10*3/uL — ABNORMAL HIGH (ref 0.1–1.0)
Monocytes Relative: 13 %
Neutro Abs: 8.1 10*3/uL — ABNORMAL HIGH (ref 1.7–7.7)
Neutrophils Relative %: 76 %
Platelets: 294 10*3/uL (ref 150–400)
RBC: 3.86 MIL/uL — ABNORMAL LOW (ref 4.22–5.81)
RDW: 16.3 % — ABNORMAL HIGH (ref 11.5–15.5)
WBC: 10.7 10*3/uL — ABNORMAL HIGH (ref 4.0–10.5)
nRBC: 0 % (ref 0.0–0.2)

## 2023-07-17 LAB — HEPATITIS B SURFACE ANTIBODY, QUANTITATIVE: Hep B S AB Quant (Post): <3.5 m[IU]/mL — ABNORMAL LOW

## 2023-07-18 LAB — POTASSIUM: Potassium: 4.5 mmol/L (ref 3.5–5.1)

## 2023-07-18 MED FILL — Adenosine IV Soln 6 MG/2ML: INTRAVENOUS | Qty: 6 | Status: AC

## 2023-07-19 LAB — CBC WITH DIFFERENTIAL/PLATELET
Abs Immature Granulocytes: 0.04 10*3/uL (ref 0.00–0.07)
Basophils Absolute: 0 10*3/uL (ref 0.0–0.1)
Basophils Relative: 0 %
Eosinophils Absolute: 0.2 10*3/uL (ref 0.0–0.5)
Eosinophils Relative: 3 %
HCT: 28.1 % — ABNORMAL LOW (ref 39.0–52.0)
Hemoglobin: 8.7 g/dL — ABNORMAL LOW (ref 13.0–17.0)
Immature Granulocytes: 1 %
Lymphocytes Relative: 14 %
Lymphs Abs: 1.1 10*3/uL (ref 0.7–4.0)
MCH: 25.8 pg — ABNORMAL LOW (ref 26.0–34.0)
MCHC: 31 g/dL (ref 30.0–36.0)
MCV: 83.4 fL (ref 80.0–100.0)
Monocytes Absolute: 1.1 10*3/uL — ABNORMAL HIGH (ref 0.1–1.0)
Monocytes Relative: 14 %
Neutro Abs: 5.3 10*3/uL (ref 1.7–7.7)
Neutrophils Relative %: 68 %
Platelets: 273 10*3/uL (ref 150–400)
RBC: 3.37 MIL/uL — ABNORMAL LOW (ref 4.22–5.81)
RDW: 16.3 % — ABNORMAL HIGH (ref 11.5–15.5)
WBC: 7.8 10*3/uL (ref 4.0–10.5)
nRBC: 0 % (ref 0.0–0.2)

## 2023-07-19 LAB — RENAL FUNCTION PANEL
Albumin: 2.2 g/dL — ABNORMAL LOW (ref 3.5–5.0)
Anion gap: 12 (ref 5–15)
BUN: 41 mg/dL — ABNORMAL HIGH (ref 8–23)
CO2: 29 mmol/L (ref 22–32)
Calcium: 8.9 mg/dL (ref 8.9–10.3)
Chloride: 97 mmol/L — ABNORMAL LOW (ref 98–111)
Creatinine, Ser: 4.81 mg/dL — ABNORMAL HIGH (ref 0.61–1.24)
GFR, Estimated: 11 mL/min — ABNORMAL LOW (ref >60)
Glucose, Bld: 128 mg/dL — ABNORMAL HIGH (ref 70–99)
Phosphorus: 2.6 mg/dL (ref 2.5–4.6)
Potassium: 5.2 mmol/L — ABNORMAL HIGH (ref 3.5–5.1)
Sodium: 138 mmol/L (ref 135–145)

## 2023-07-20 LAB — HEPATITIS B CORE ANTIBODY, TOTAL: HEP B CORE AB: NEGATIVE

## 2023-07-21 LAB — CBC WITH DIFFERENTIAL/PLATELET
Abs Immature Granulocytes: 0.05 10*3/uL (ref 0.00–0.07)
Basophils Absolute: 0 10*3/uL (ref 0.0–0.1)
Basophils Relative: 0 %
Eosinophils Absolute: 0.1 10*3/uL (ref 0.0–0.5)
Eosinophils Relative: 1 %
HCT: 28.8 % — ABNORMAL LOW (ref 39.0–52.0)
Hemoglobin: 9 g/dL — ABNORMAL LOW (ref 13.0–17.0)
Immature Granulocytes: 1 %
Lymphocytes Relative: 10 %
Lymphs Abs: 0.9 10*3/uL (ref 0.7–4.0)
MCH: 25.4 pg — ABNORMAL LOW (ref 26.0–34.0)
MCHC: 31.3 g/dL (ref 30.0–36.0)
MCV: 81.4 fL (ref 80.0–100.0)
Monocytes Absolute: 1.1 10*3/uL — ABNORMAL HIGH (ref 0.1–1.0)
Monocytes Relative: 12 %
Neutro Abs: 7.6 10*3/uL (ref 1.7–7.7)
Neutrophils Relative %: 76 %
Platelets: 278 10*3/uL (ref 150–400)
RBC: 3.54 MIL/uL — ABNORMAL LOW (ref 4.22–5.81)
RDW: 16.1 % — ABNORMAL HIGH (ref 11.5–15.5)
WBC: 9.8 10*3/uL (ref 4.0–10.5)
nRBC: 0 % (ref 0.0–0.2)

## 2023-07-21 LAB — RENAL FUNCTION PANEL
Albumin: 2 g/dL — ABNORMAL LOW (ref 3.5–5.0)
Anion gap: 9 (ref 5–15)
BUN: 17 mg/dL (ref 8–23)
CO2: 29 mmol/L (ref 22–32)
Calcium: 8.4 mg/dL — ABNORMAL LOW (ref 8.9–10.3)
Chloride: 99 mmol/L (ref 98–111)
Creatinine, Ser: 2.6 mg/dL — ABNORMAL HIGH (ref 0.61–1.24)
GFR, Estimated: 24 mL/min — ABNORMAL LOW (ref >60)
Glucose, Bld: 171 mg/dL — ABNORMAL HIGH (ref 70–99)
Phosphorus: 2 mg/dL — ABNORMAL LOW (ref 2.5–4.6)
Potassium: 4.4 mmol/L (ref 3.5–5.1)
Sodium: 137 mmol/L (ref 135–145)

## 2023-07-24 LAB — CBC WITH DIFFERENTIAL/PLATELET
Abs Immature Granulocytes: 0.06 10*3/uL (ref 0.00–0.07)
Basophils Absolute: 0 10*3/uL (ref 0.0–0.1)
Basophils Relative: 0 %
Eosinophils Absolute: 0.2 10*3/uL (ref 0.0–0.5)
Eosinophils Relative: 2 %
HCT: 31.5 % — ABNORMAL LOW (ref 39.0–52.0)
Hemoglobin: 9.6 g/dL — ABNORMAL LOW (ref 13.0–17.0)
Immature Granulocytes: 1 %
Lymphocytes Relative: 16 %
Lymphs Abs: 1.4 10*3/uL (ref 0.7–4.0)
MCH: 25.3 pg — ABNORMAL LOW (ref 26.0–34.0)
MCHC: 30.5 g/dL (ref 30.0–36.0)
MCV: 83.1 fL (ref 80.0–100.0)
Monocytes Absolute: 1 10*3/uL (ref 0.1–1.0)
Monocytes Relative: 11 %
Neutro Abs: 6 10*3/uL (ref 1.7–7.7)
Neutrophils Relative %: 70 %
Platelets: 196 10*3/uL (ref 150–400)
RBC: 3.79 MIL/uL — ABNORMAL LOW (ref 4.22–5.81)
RDW: 16.3 % — ABNORMAL HIGH (ref 11.5–15.5)
WBC: 8.7 10*3/uL (ref 4.0–10.5)
nRBC: 0.3 % — ABNORMAL HIGH (ref 0.0–0.2)

## 2023-07-24 LAB — RENAL FUNCTION PANEL
Albumin: 2 g/dL — ABNORMAL LOW (ref 3.5–5.0)
Anion gap: 10 (ref 5–15)
BUN: 51 mg/dL — ABNORMAL HIGH (ref 8–23)
CO2: 24 mmol/L (ref 22–32)
Calcium: 8.4 mg/dL — ABNORMAL LOW (ref 8.9–10.3)
Chloride: 104 mmol/L (ref 98–111)
Creatinine, Ser: 4.88 mg/dL — ABNORMAL HIGH (ref 0.61–1.24)
GFR, Estimated: 11 mL/min — ABNORMAL LOW (ref >60)
Glucose, Bld: 121 mg/dL — ABNORMAL HIGH (ref 70–99)
Phosphorus: 2.5 mg/dL (ref 2.5–4.6)
Potassium: 5.2 mmol/L — ABNORMAL HIGH (ref 3.5–5.1)
Sodium: 138 mmol/L (ref 135–145)

## 2023-07-26 ENCOUNTER — Other Ambulatory Visit (HOSPITAL_COMMUNITY): Payer: Self-pay

## 2023-07-26 LAB — CBC WITH DIFFERENTIAL/PLATELET
Abs Immature Granulocytes: 0.05 10*3/uL (ref 0.00–0.07)
Basophils Absolute: 0 10*3/uL (ref 0.0–0.1)
Basophils Relative: 0 %
Eosinophils Absolute: 0.3 10*3/uL (ref 0.0–0.5)
Eosinophils Relative: 4 %
HCT: 29.7 % — ABNORMAL LOW (ref 39.0–52.0)
Hemoglobin: 8.9 g/dL — ABNORMAL LOW (ref 13.0–17.0)
Immature Granulocytes: 1 %
Lymphocytes Relative: 17 %
Lymphs Abs: 1.3 10*3/uL (ref 0.7–4.0)
MCH: 24.9 pg — ABNORMAL LOW (ref 26.0–34.0)
MCHC: 30 g/dL (ref 30.0–36.0)
MCV: 83 fL (ref 80.0–100.0)
Monocytes Absolute: 0.9 10*3/uL (ref 0.1–1.0)
Monocytes Relative: 12 %
Neutro Abs: 4.9 10*3/uL (ref 1.7–7.7)
Neutrophils Relative %: 66 %
Platelets: UNDETERMINED 10*3/uL (ref 150–400)
RBC: 3.58 MIL/uL — ABNORMAL LOW (ref 4.22–5.81)
RDW: 16.5 % — ABNORMAL HIGH (ref 11.5–15.5)
WBC: 7.4 10*3/uL (ref 4.0–10.5)
nRBC: 0 % (ref 0.0–0.2)

## 2023-07-26 LAB — RENAL FUNCTION PANEL
Albumin: 2.1 g/dL — ABNORMAL LOW (ref 3.5–5.0)
Anion gap: 16 — ABNORMAL HIGH (ref 5–15)
BUN: 39 mg/dL — ABNORMAL HIGH (ref 8–23)
CO2: 27 mmol/L (ref 22–32)
Calcium: 9.2 mg/dL (ref 8.9–10.3)
Chloride: 99 mmol/L (ref 98–111)
Creatinine, Ser: 4.13 mg/dL — ABNORMAL HIGH (ref 0.61–1.24)
GFR, Estimated: 14 mL/min — ABNORMAL LOW (ref >60)
Glucose, Bld: 87 mg/dL (ref 70–99)
Phosphorus: 3.3 mg/dL (ref 2.5–4.6)
Potassium: 5.4 mmol/L — ABNORMAL HIGH (ref 3.5–5.1)
Sodium: 142 mmol/L (ref 135–145)

## 2023-07-26 LAB — RESP PANEL BY RT-PCR (RSV, FLU A&B, COVID)  RVPGX2
Influenza A by PCR: NEGATIVE
Influenza B by PCR: NEGATIVE
Resp Syncytial Virus by PCR: NEGATIVE
SARS Coronavirus 2 by RT PCR: NEGATIVE

## 2023-07-27 LAB — POTASSIUM: Potassium: 4.8 mmol/L (ref 3.5–5.1)

## 2023-07-28 LAB — RENAL FUNCTION PANEL
Albumin: 2 g/dL — ABNORMAL LOW (ref 3.5–5.0)
Anion gap: 9 (ref 5–15)
BUN: 36 mg/dL — ABNORMAL HIGH (ref 8–23)
CO2: 26 mmol/L (ref 22–32)
Calcium: 8.4 mg/dL — ABNORMAL LOW (ref 8.9–10.3)
Chloride: 102 mmol/L (ref 98–111)
Creatinine, Ser: 3.61 mg/dL — ABNORMAL HIGH (ref 0.61–1.24)
GFR, Estimated: 16 mL/min — ABNORMAL LOW (ref >60)
Glucose, Bld: 83 mg/dL (ref 70–99)
Phosphorus: 3.3 mg/dL (ref 2.5–4.6)
Potassium: 4.3 mmol/L (ref 3.5–5.1)
Sodium: 137 mmol/L (ref 135–145)

## 2023-07-28 LAB — CBC WITH DIFFERENTIAL/PLATELET
Abs Immature Granulocytes: 0.02 10*3/uL (ref 0.00–0.07)
Basophils Absolute: 0 10*3/uL (ref 0.0–0.1)
Basophils Relative: 0 %
Eosinophils Absolute: 0.1 10*3/uL (ref 0.0–0.5)
Eosinophils Relative: 2 %
HCT: 26.6 % — ABNORMAL LOW (ref 39.0–52.0)
Hemoglobin: 8.1 g/dL — ABNORMAL LOW (ref 13.0–17.0)
Immature Granulocytes: 0 %
Lymphocytes Relative: 22 %
Lymphs Abs: 1.3 10*3/uL (ref 0.7–4.0)
MCH: 25.1 pg — ABNORMAL LOW (ref 26.0–34.0)
MCHC: 30.5 g/dL (ref 30.0–36.0)
MCV: 82.4 fL (ref 80.0–100.0)
Monocytes Absolute: 0.8 10*3/uL (ref 0.1–1.0)
Monocytes Relative: 13 %
Neutro Abs: 3.5 10*3/uL (ref 1.7–7.7)
Neutrophils Relative %: 63 %
Platelets: 226 10*3/uL (ref 150–400)
RBC: 3.23 MIL/uL — ABNORMAL LOW (ref 4.22–5.81)
RDW: 16.5 % — ABNORMAL HIGH (ref 11.5–15.5)
WBC: 5.7 10*3/uL (ref 4.0–10.5)
nRBC: 0 % (ref 0.0–0.2)

## 2023-07-31 LAB — CBC WITH DIFFERENTIAL/PLATELET
Abs Immature Granulocytes: 0.03 10*3/uL (ref 0.00–0.07)
Basophils Absolute: 0 10*3/uL (ref 0.0–0.1)
Basophils Relative: 0 %
Eosinophils Absolute: 0.1 10*3/uL (ref 0.0–0.5)
Eosinophils Relative: 2 %
HCT: 30.7 % — ABNORMAL LOW (ref 39.0–52.0)
Hemoglobin: 9.4 g/dL — ABNORMAL LOW (ref 13.0–17.0)
Immature Granulocytes: 0 %
Lymphocytes Relative: 13 %
Lymphs Abs: 0.9 10*3/uL (ref 0.7–4.0)
MCH: 25.2 pg — ABNORMAL LOW (ref 26.0–34.0)
MCHC: 30.6 g/dL (ref 30.0–36.0)
MCV: 82.3 fL (ref 80.0–100.0)
Monocytes Absolute: 0.9 10*3/uL (ref 0.1–1.0)
Monocytes Relative: 12 %
Neutro Abs: 5.4 10*3/uL (ref 1.7–7.7)
Neutrophils Relative %: 73 %
Platelets: 261 10*3/uL (ref 150–400)
RBC: 3.73 MIL/uL — ABNORMAL LOW (ref 4.22–5.81)
RDW: 17.2 % — ABNORMAL HIGH (ref 11.5–15.5)
WBC: 7.4 10*3/uL (ref 4.0–10.5)
nRBC: 0 % (ref 0.0–0.2)

## 2023-07-31 LAB — RENAL FUNCTION PANEL
Albumin: 2.3 g/dL — ABNORMAL LOW (ref 3.5–5.0)
Anion gap: 8 (ref 5–15)
BUN: 21 mg/dL (ref 8–23)
CO2: 29 mmol/L (ref 22–32)
Calcium: 8.8 mg/dL — ABNORMAL LOW (ref 8.9–10.3)
Chloride: 102 mmol/L (ref 98–111)
Creatinine, Ser: 2.33 mg/dL — ABNORMAL HIGH (ref 0.61–1.24)
GFR, Estimated: 27 mL/min — ABNORMAL LOW (ref >60)
Glucose, Bld: 119 mg/dL — ABNORMAL HIGH (ref 70–99)
Phosphorus: 2 mg/dL — ABNORMAL LOW (ref 2.5–4.6)
Potassium: 3.9 mmol/L (ref 3.5–5.1)
Sodium: 139 mmol/L (ref 135–145)

## 2023-08-02 LAB — CBC WITH DIFFERENTIAL/PLATELET
Abs Immature Granulocytes: 0.02 10*3/uL (ref 0.00–0.07)
Basophils Absolute: 0 10*3/uL (ref 0.0–0.1)
Basophils Relative: 0 %
Eosinophils Absolute: 0.1 10*3/uL (ref 0.0–0.5)
Eosinophils Relative: 2 %
HCT: 33.1 % — ABNORMAL LOW (ref 39.0–52.0)
Hemoglobin: 9.7 g/dL — ABNORMAL LOW (ref 13.0–17.0)
Immature Granulocytes: 0 %
Lymphocytes Relative: 17 %
Lymphs Abs: 1.1 10*3/uL (ref 0.7–4.0)
MCH: 24.9 pg — ABNORMAL LOW (ref 26.0–34.0)
MCHC: 29.3 g/dL — ABNORMAL LOW (ref 30.0–36.0)
MCV: 85.1 fL (ref 80.0–100.0)
Monocytes Absolute: 0.9 10*3/uL (ref 0.1–1.0)
Monocytes Relative: 13 %
Neutro Abs: 4.5 10*3/uL (ref 1.7–7.7)
Neutrophils Relative %: 68 %
Platelets: 181 10*3/uL (ref 150–400)
RBC: 3.89 MIL/uL — ABNORMAL LOW (ref 4.22–5.81)
RDW: 18 % — ABNORMAL HIGH (ref 11.5–15.5)
WBC: 6.7 10*3/uL (ref 4.0–10.5)
nRBC: 0 % (ref 0.0–0.2)

## 2023-08-02 LAB — RENAL FUNCTION PANEL
Albumin: 2.3 g/dL — ABNORMAL LOW (ref 3.5–5.0)
Anion gap: 8 (ref 5–15)
BUN: 39 mg/dL — ABNORMAL HIGH (ref 8–23)
CO2: 25 mmol/L (ref 22–32)
Calcium: 8.5 mg/dL — ABNORMAL LOW (ref 8.9–10.3)
Chloride: 106 mmol/L (ref 98–111)
Creatinine, Ser: 3.58 mg/dL — ABNORMAL HIGH (ref 0.61–1.24)
GFR, Estimated: 16 mL/min — ABNORMAL LOW (ref >60)
Glucose, Bld: 110 mg/dL — ABNORMAL HIGH (ref 70–99)
Phosphorus: 4 mg/dL (ref 2.5–4.6)
Potassium: 4.8 mmol/L (ref 3.5–5.1)
Sodium: 139 mmol/L (ref 135–145)

## 2023-08-04 LAB — RENAL FUNCTION PANEL
Albumin: 2.3 g/dL — ABNORMAL LOW (ref 3.5–5.0)
Anion gap: 14 (ref 5–15)
BUN: 38 mg/dL — ABNORMAL HIGH (ref 8–23)
CO2: 27 mmol/L (ref 22–32)
Calcium: 9.2 mg/dL (ref 8.9–10.3)
Chloride: 102 mmol/L (ref 98–111)
Creatinine, Ser: 3.54 mg/dL — ABNORMAL HIGH (ref 0.61–1.24)
GFR, Estimated: 16 mL/min — ABNORMAL LOW (ref >60)
Glucose, Bld: 134 mg/dL — ABNORMAL HIGH (ref 70–99)
Phosphorus: 4 mg/dL (ref 2.5–4.6)
Potassium: 5.3 mmol/L — ABNORMAL HIGH (ref 3.5–5.1)
Sodium: 143 mmol/L (ref 135–145)

## 2023-08-04 LAB — CBC WITH DIFFERENTIAL/PLATELET
Abs Immature Granulocytes: 0.03 10*3/uL (ref 0.00–0.07)
Basophils Absolute: 0 10*3/uL (ref 0.0–0.1)
Basophils Relative: 1 %
Eosinophils Absolute: 0.1 10*3/uL (ref 0.0–0.5)
Eosinophils Relative: 1 %
HCT: 31.7 % — ABNORMAL LOW (ref 39.0–52.0)
Hemoglobin: 9.6 g/dL — ABNORMAL LOW (ref 13.0–17.0)
Immature Granulocytes: 1 %
Lymphocytes Relative: 18 %
Lymphs Abs: 1.2 10*3/uL (ref 0.7–4.0)
MCH: 25.4 pg — ABNORMAL LOW (ref 26.0–34.0)
MCHC: 30.3 g/dL (ref 30.0–36.0)
MCV: 83.9 fL (ref 80.0–100.0)
Monocytes Absolute: 0.9 10*3/uL (ref 0.1–1.0)
Monocytes Relative: 14 %
Neutro Abs: 4.2 10*3/uL (ref 1.7–7.7)
Neutrophils Relative %: 65 %
Platelets: 267 10*3/uL (ref 150–400)
RBC: 3.78 MIL/uL — ABNORMAL LOW (ref 4.22–5.81)
RDW: 17.4 % — ABNORMAL HIGH (ref 11.5–15.5)
WBC: 6.4 10*3/uL (ref 4.0–10.5)
nRBC: 0 % (ref 0.0–0.2)

## 2023-08-07 LAB — CBC WITH DIFFERENTIAL/PLATELET
Abs Immature Granulocytes: 0.03 10*3/uL (ref 0.00–0.07)
Basophils Absolute: 0 10*3/uL (ref 0.0–0.1)
Basophils Relative: 0 %
Eosinophils Absolute: 0.1 10*3/uL (ref 0.0–0.5)
Eosinophils Relative: 2 %
HCT: 30 % — ABNORMAL LOW (ref 39.0–52.0)
Hemoglobin: 9.1 g/dL — ABNORMAL LOW (ref 13.0–17.0)
Immature Granulocytes: 1 %
Lymphocytes Relative: 16 %
Lymphs Abs: 1 10*3/uL (ref 0.7–4.0)
MCH: 24.9 pg — ABNORMAL LOW (ref 26.0–34.0)
MCHC: 30.3 g/dL (ref 30.0–36.0)
MCV: 82.2 fL (ref 80.0–100.0)
Monocytes Absolute: 0.9 10*3/uL (ref 0.1–1.0)
Monocytes Relative: 14 %
Neutro Abs: 4.1 10*3/uL (ref 1.7–7.7)
Neutrophils Relative %: 67 %
Platelets: 253 10*3/uL (ref 150–400)
RBC: 3.65 MIL/uL — ABNORMAL LOW (ref 4.22–5.81)
RDW: 17.2 % — ABNORMAL HIGH (ref 11.5–15.5)
WBC: 6.1 10*3/uL (ref 4.0–10.5)
nRBC: 0 % (ref 0.0–0.2)

## 2023-08-07 LAB — RENAL FUNCTION PANEL
Albumin: 2.1 g/dL — ABNORMAL LOW (ref 3.5–5.0)
Anion gap: 9 (ref 5–15)
BUN: 55 mg/dL — ABNORMAL HIGH (ref 8–23)
CO2: 26 mmol/L (ref 22–32)
Calcium: 8.6 mg/dL — ABNORMAL LOW (ref 8.9–10.3)
Chloride: 102 mmol/L (ref 98–111)
Creatinine, Ser: 4.48 mg/dL — ABNORMAL HIGH (ref 0.61–1.24)
GFR, Estimated: 12 mL/min — ABNORMAL LOW (ref >60)
Glucose, Bld: 134 mg/dL — ABNORMAL HIGH (ref 70–99)
Phosphorus: 4.2 mg/dL (ref 2.5–4.6)
Potassium: 5.1 mmol/L (ref 3.5–5.1)
Sodium: 137 mmol/L (ref 135–145)

## 2023-08-09 LAB — CBC WITH DIFFERENTIAL/PLATELET
Abs Immature Granulocytes: 0.03 10*3/uL (ref 0.00–0.07)
Basophils Absolute: 0 10*3/uL (ref 0.0–0.1)
Basophils Relative: 1 %
Eosinophils Absolute: 0.1 10*3/uL (ref 0.0–0.5)
Eosinophils Relative: 2 %
HCT: 31.3 % — ABNORMAL LOW (ref 39.0–52.0)
Hemoglobin: 9.4 g/dL — ABNORMAL LOW (ref 13.0–17.0)
Immature Granulocytes: 1 %
Lymphocytes Relative: 19 %
Lymphs Abs: 1.2 10*3/uL (ref 0.7–4.0)
MCH: 24.4 pg — ABNORMAL LOW (ref 26.0–34.0)
MCHC: 30 g/dL (ref 30.0–36.0)
MCV: 81.3 fL (ref 80.0–100.0)
Monocytes Absolute: 1 10*3/uL (ref 0.1–1.0)
Monocytes Relative: 15 %
Neutro Abs: 3.9 10*3/uL (ref 1.7–7.7)
Neutrophils Relative %: 62 %
Platelets: 269 10*3/uL (ref 150–400)
RBC: 3.85 MIL/uL — ABNORMAL LOW (ref 4.22–5.81)
RDW: 16.9 % — ABNORMAL HIGH (ref 11.5–15.5)
WBC: 6.2 10*3/uL (ref 4.0–10.5)
nRBC: 0 % (ref 0.0–0.2)

## 2023-08-09 LAB — RENAL FUNCTION PANEL
Albumin: 2.1 g/dL — ABNORMAL LOW (ref 3.5–5.0)
Anion gap: 11 (ref 5–15)
BUN: 45 mg/dL — ABNORMAL HIGH (ref 8–23)
CO2: 27 mmol/L (ref 22–32)
Calcium: 8.7 mg/dL — ABNORMAL LOW (ref 8.9–10.3)
Chloride: 100 mmol/L (ref 98–111)
Creatinine, Ser: 3.82 mg/dL — ABNORMAL HIGH (ref 0.61–1.24)
GFR, Estimated: 15 mL/min — ABNORMAL LOW (ref >60)
Glucose, Bld: 129 mg/dL — ABNORMAL HIGH (ref 70–99)
Phosphorus: 4.6 mg/dL (ref 2.5–4.6)
Potassium: 4.8 mmol/L (ref 3.5–5.1)
Sodium: 138 mmol/L (ref 135–145)

## 2023-08-11 LAB — CBC WITH DIFFERENTIAL/PLATELET
Abs Immature Granulocytes: 0.01 10*3/uL (ref 0.00–0.07)
Basophils Absolute: 0 10*3/uL (ref 0.0–0.1)
Basophils Relative: 1 %
Eosinophils Absolute: 0.2 10*3/uL (ref 0.0–0.5)
Eosinophils Relative: 3 %
HCT: 33.3 % — ABNORMAL LOW (ref 39.0–52.0)
Hemoglobin: 10.1 g/dL — ABNORMAL LOW (ref 13.0–17.0)
Immature Granulocytes: 0 %
Lymphocytes Relative: 25 %
Lymphs Abs: 1.4 10*3/uL (ref 0.7–4.0)
MCH: 25.1 pg — ABNORMAL LOW (ref 26.0–34.0)
MCHC: 30.3 g/dL (ref 30.0–36.0)
MCV: 82.6 fL (ref 80.0–100.0)
Monocytes Absolute: 0.9 10*3/uL (ref 0.1–1.0)
Monocytes Relative: 16 %
Neutro Abs: 3.2 10*3/uL (ref 1.7–7.7)
Neutrophils Relative %: 55 %
Platelets: 253 10*3/uL (ref 150–400)
RBC: 4.03 MIL/uL — ABNORMAL LOW (ref 4.22–5.81)
RDW: 16.7 % — ABNORMAL HIGH (ref 11.5–15.5)
WBC: 5.7 10*3/uL (ref 4.0–10.5)
nRBC: 0 % (ref 0.0–0.2)

## 2023-08-11 LAB — RENAL FUNCTION PANEL
Albumin: 2.3 g/dL — ABNORMAL LOW (ref 3.5–5.0)
Anion gap: 11 (ref 5–15)
BUN: 43 mg/dL — ABNORMAL HIGH (ref 8–23)
CO2: 28 mmol/L (ref 22–32)
Calcium: 9 mg/dL (ref 8.9–10.3)
Chloride: 98 mmol/L (ref 98–111)
Creatinine, Ser: 3.67 mg/dL — ABNORMAL HIGH (ref 0.61–1.24)
GFR, Estimated: 16 mL/min — ABNORMAL LOW (ref >60)
Glucose, Bld: 133 mg/dL — ABNORMAL HIGH (ref 70–99)
Phosphorus: 4.2 mg/dL (ref 2.5–4.6)
Potassium: 5.2 mmol/L — ABNORMAL HIGH (ref 3.5–5.1)
Sodium: 137 mmol/L (ref 135–145)

## 2023-08-12 LAB — POTASSIUM: Potassium: 4.5 mmol/L (ref 3.5–5.1)

## 2023-08-13 LAB — HEPATITIS B SURFACE ANTIGEN: Hepatitis B Surface Ag: NONREACTIVE

## 2023-08-14 LAB — RENAL FUNCTION PANEL
Albumin: 2.3 g/dL — ABNORMAL LOW (ref 3.5–5.0)
Anion gap: 10 (ref 5–15)
BUN: 52 mg/dL — ABNORMAL HIGH (ref 8–23)
CO2: 28 mmol/L (ref 22–32)
Calcium: 8.9 mg/dL (ref 8.9–10.3)
Chloride: 99 mmol/L (ref 98–111)
Creatinine, Ser: 4.1 mg/dL — ABNORMAL HIGH (ref 0.61–1.24)
GFR, Estimated: 14 mL/min — ABNORMAL LOW (ref >60)
Glucose, Bld: 129 mg/dL — ABNORMAL HIGH (ref 70–99)
Phosphorus: 4.3 mg/dL (ref 2.5–4.6)
Potassium: 4.7 mmol/L (ref 3.5–5.1)
Sodium: 137 mmol/L (ref 135–145)

## 2023-08-14 LAB — CBC WITH DIFFERENTIAL/PLATELET
Abs Immature Granulocytes: 0.02 10*3/uL (ref 0.00–0.07)
Basophils Absolute: 0 10*3/uL (ref 0.0–0.1)
Basophils Relative: 0 %
Eosinophils Absolute: 0.2 10*3/uL (ref 0.0–0.5)
Eosinophils Relative: 4 %
HCT: 34.7 % — ABNORMAL LOW (ref 39.0–52.0)
Hemoglobin: 10.6 g/dL — ABNORMAL LOW (ref 13.0–17.0)
Immature Granulocytes: 0 %
Lymphocytes Relative: 19 %
Lymphs Abs: 1.1 10*3/uL (ref 0.7–4.0)
MCH: 24.8 pg — ABNORMAL LOW (ref 26.0–34.0)
MCHC: 30.5 g/dL (ref 30.0–36.0)
MCV: 81.1 fL (ref 80.0–100.0)
Monocytes Absolute: 0.8 10*3/uL (ref 0.1–1.0)
Monocytes Relative: 13 %
Neutro Abs: 3.8 10*3/uL (ref 1.7–7.7)
Neutrophils Relative %: 64 %
Platelets: 274 10*3/uL (ref 150–400)
RBC: 4.28 MIL/uL (ref 4.22–5.81)
RDW: 16.8 % — ABNORMAL HIGH (ref 11.5–15.5)
WBC: 6 10*3/uL (ref 4.0–10.5)
nRBC: 0 % (ref 0.0–0.2)

## 2023-08-14 LAB — HEPATITIS B SURFACE ANTIBODY, QUANTITATIVE: Hep B S AB Quant (Post): <3.5 m[IU]/mL — ABNORMAL LOW

## 2023-08-16 LAB — CBC WITH DIFFERENTIAL/PLATELET
Abs Immature Granulocytes: 0.03 10*3/uL (ref 0.00–0.07)
Basophils Absolute: 0 10*3/uL (ref 0.0–0.1)
Basophils Relative: 0 %
Eosinophils Absolute: 0.3 10*3/uL (ref 0.0–0.5)
Eosinophils Relative: 4 %
HCT: 39.3 % (ref 39.0–52.0)
Hemoglobin: 11.9 g/dL — ABNORMAL LOW (ref 13.0–17.0)
Immature Granulocytes: 0 %
Lymphocytes Relative: 20 %
Lymphs Abs: 1.5 10*3/uL (ref 0.7–4.0)
MCH: 24.8 pg — ABNORMAL LOW (ref 26.0–34.0)
MCHC: 30.3 g/dL (ref 30.0–36.0)
MCV: 82 fL (ref 80.0–100.0)
Monocytes Absolute: 0.8 10*3/uL (ref 0.1–1.0)
Monocytes Relative: 11 %
Neutro Abs: 4.6 10*3/uL (ref 1.7–7.7)
Neutrophils Relative %: 65 %
Platelets: 267 10*3/uL (ref 150–400)
RBC: 4.79 MIL/uL (ref 4.22–5.81)
RDW: 17.5 % — ABNORMAL HIGH (ref 11.5–15.5)
WBC: 7.2 10*3/uL (ref 4.0–10.5)
nRBC: 0 % (ref 0.0–0.2)

## 2023-08-16 LAB — RENAL FUNCTION PANEL
Albumin: 2.3 g/dL — ABNORMAL LOW (ref 3.5–5.0)
Anion gap: 14 (ref 5–15)
BUN: 47 mg/dL — ABNORMAL HIGH (ref 8–23)
CO2: 23 mmol/L (ref 22–32)
Calcium: 8.8 mg/dL — ABNORMAL LOW (ref 8.9–10.3)
Chloride: 98 mmol/L (ref 98–111)
Creatinine, Ser: 3.37 mg/dL — ABNORMAL HIGH (ref 0.61–1.24)
GFR, Estimated: 17 mL/min — ABNORMAL LOW (ref >60)
Glucose, Bld: 130 mg/dL — ABNORMAL HIGH (ref 70–99)
Phosphorus: 3.8 mg/dL (ref 2.5–4.6)
Potassium: 5.5 mmol/L — ABNORMAL HIGH (ref 3.5–5.1)
Sodium: 135 mmol/L (ref 135–145)

## 2023-08-17 LAB — POTASSIUM: Potassium: 4.6 mmol/L (ref 3.5–5.1)

## 2023-08-18 LAB — CBC WITH DIFFERENTIAL/PLATELET
Abs Immature Granulocytes: 0.02 10*3/uL (ref 0.00–0.07)
Basophils Absolute: 0 10*3/uL (ref 0.0–0.1)
Basophils Relative: 0 %
Eosinophils Absolute: 0.3 10*3/uL (ref 0.0–0.5)
Eosinophils Relative: 4 %
HCT: 33.5 % — ABNORMAL LOW (ref 39.0–52.0)
Hemoglobin: 10.2 g/dL — ABNORMAL LOW (ref 13.0–17.0)
Immature Granulocytes: 0 %
Lymphocytes Relative: 18 %
Lymphs Abs: 1.3 10*3/uL (ref 0.7–4.0)
MCH: 24.8 pg — ABNORMAL LOW (ref 26.0–34.0)
MCHC: 30.4 g/dL (ref 30.0–36.0)
MCV: 81.3 fL (ref 80.0–100.0)
Monocytes Absolute: 0.9 10*3/uL (ref 0.1–1.0)
Monocytes Relative: 13 %
Neutro Abs: 4.6 10*3/uL (ref 1.7–7.7)
Neutrophils Relative %: 65 %
Platelets: 230 10*3/uL (ref 150–400)
RBC: 4.12 MIL/uL — ABNORMAL LOW (ref 4.22–5.81)
RDW: 17.3 % — ABNORMAL HIGH (ref 11.5–15.5)
WBC: 7.1 10*3/uL (ref 4.0–10.5)
nRBC: 0 % (ref 0.0–0.2)

## 2023-08-18 LAB — RENAL FUNCTION PANEL
Albumin: 2.3 g/dL — ABNORMAL LOW (ref 3.5–5.0)
Anion gap: 9 (ref 5–15)
BUN: 51 mg/dL — ABNORMAL HIGH (ref 8–23)
CO2: 29 mmol/L (ref 22–32)
Calcium: 9 mg/dL (ref 8.9–10.3)
Chloride: 97 mmol/L — ABNORMAL LOW (ref 98–111)
Creatinine, Ser: 3.73 mg/dL — ABNORMAL HIGH (ref 0.61–1.24)
GFR, Estimated: 15 mL/min — ABNORMAL LOW (ref >60)
Glucose, Bld: 149 mg/dL — ABNORMAL HIGH (ref 70–99)
Phosphorus: 3.9 mg/dL (ref 2.5–4.6)
Potassium: 5.5 mmol/L — ABNORMAL HIGH (ref 3.5–5.1)
Sodium: 135 mmol/L (ref 135–145)

## 2023-08-20 LAB — BASIC METABOLIC PANEL
Anion gap: 7 (ref 5–15)
BUN: 43 mg/dL — ABNORMAL HIGH (ref 8–23)
CO2: 30 mmol/L (ref 22–32)
Calcium: 8.8 mg/dL — ABNORMAL LOW (ref 8.9–10.3)
Chloride: 101 mmol/L (ref 98–111)
Creatinine, Ser: 3.2 mg/dL — ABNORMAL HIGH (ref 0.61–1.24)
GFR, Estimated: 18 mL/min — ABNORMAL LOW (ref >60)
Glucose, Bld: 116 mg/dL — ABNORMAL HIGH (ref 70–99)
Potassium: 4.8 mmol/L (ref 3.5–5.1)
Sodium: 138 mmol/L (ref 135–145)

## 2023-08-21 LAB — RENAL FUNCTION PANEL
Albumin: 2.3 g/dL — ABNORMAL LOW (ref 3.5–5.0)
Anion gap: 10 (ref 5–15)
BUN: 52 mg/dL — ABNORMAL HIGH (ref 8–23)
CO2: 26 mmol/L (ref 22–32)
Calcium: 9 mg/dL (ref 8.9–10.3)
Chloride: 102 mmol/L (ref 98–111)
Creatinine, Ser: 3.52 mg/dL — ABNORMAL HIGH (ref 0.61–1.24)
GFR, Estimated: 16 mL/min — ABNORMAL LOW (ref >60)
Glucose, Bld: 120 mg/dL — ABNORMAL HIGH (ref 70–99)
Phosphorus: 4.7 mg/dL — ABNORMAL HIGH (ref 2.5–4.6)
Potassium: 5 mmol/L (ref 3.5–5.1)
Sodium: 138 mmol/L (ref 135–145)

## 2023-08-21 LAB — CBC WITH DIFFERENTIAL/PLATELET
Abs Immature Granulocytes: 0.02 10*3/uL (ref 0.00–0.07)
Basophils Absolute: 0 10*3/uL (ref 0.0–0.1)
Basophils Relative: 0 %
Eosinophils Absolute: 0.4 10*3/uL (ref 0.0–0.5)
Eosinophils Relative: 6 %
HCT: 32.7 % — ABNORMAL LOW (ref 39.0–52.0)
Hemoglobin: 9.9 g/dL — ABNORMAL LOW (ref 13.0–17.0)
Immature Granulocytes: 0 %
Lymphocytes Relative: 25 %
Lymphs Abs: 1.5 10*3/uL (ref 0.7–4.0)
MCH: 24.6 pg — ABNORMAL LOW (ref 26.0–34.0)
MCHC: 30.3 g/dL (ref 30.0–36.0)
MCV: 81.1 fL (ref 80.0–100.0)
Monocytes Absolute: 0.7 10*3/uL (ref 0.1–1.0)
Monocytes Relative: 12 %
Neutro Abs: 3.4 10*3/uL (ref 1.7–7.7)
Neutrophils Relative %: 57 %
Platelets: 228 10*3/uL (ref 150–400)
RBC: 4.03 MIL/uL — ABNORMAL LOW (ref 4.22–5.81)
RDW: 17.3 % — ABNORMAL HIGH (ref 11.5–15.5)
WBC: 6 10*3/uL (ref 4.0–10.5)
nRBC: 0 % (ref 0.0–0.2)

## 2023-08-23 LAB — CBC WITH DIFFERENTIAL/PLATELET
Abs Immature Granulocytes: 0.03 10*3/uL (ref 0.00–0.07)
Basophils Absolute: 0 10*3/uL (ref 0.0–0.1)
Basophils Relative: 1 %
Eosinophils Absolute: 0.3 10*3/uL (ref 0.0–0.5)
Eosinophils Relative: 5 %
HCT: 36.1 % — ABNORMAL LOW (ref 39.0–52.0)
Hemoglobin: 11.3 g/dL — ABNORMAL LOW (ref 13.0–17.0)
Immature Granulocytes: 1 %
Lymphocytes Relative: 21 %
Lymphs Abs: 1.3 10*3/uL (ref 0.7–4.0)
MCH: 25.3 pg — ABNORMAL LOW (ref 26.0–34.0)
MCHC: 31.3 g/dL (ref 30.0–36.0)
MCV: 80.9 fL (ref 80.0–100.0)
Monocytes Absolute: 0.8 10*3/uL (ref 0.1–1.0)
Monocytes Relative: 12 %
Neutro Abs: 3.7 10*3/uL (ref 1.7–7.7)
Neutrophils Relative %: 60 %
Platelets: 207 10*3/uL (ref 150–400)
RBC: 4.46 MIL/uL (ref 4.22–5.81)
RDW: 17.4 % — ABNORMAL HIGH (ref 11.5–15.5)
WBC: 6.1 10*3/uL (ref 4.0–10.5)
nRBC: 0 % (ref 0.0–0.2)

## 2023-08-23 LAB — RENAL FUNCTION PANEL
Albumin: 2.5 g/dL — ABNORMAL LOW (ref 3.5–5.0)
Anion gap: 9 (ref 5–15)
BUN: 42 mg/dL — ABNORMAL HIGH (ref 8–23)
CO2: 27 mmol/L (ref 22–32)
Calcium: 9.1 mg/dL (ref 8.9–10.3)
Chloride: 103 mmol/L (ref 98–111)
Creatinine, Ser: 3.1 mg/dL — ABNORMAL HIGH (ref 0.61–1.24)
GFR, Estimated: 19 mL/min — ABNORMAL LOW (ref >60)
Glucose, Bld: 133 mg/dL — ABNORMAL HIGH (ref 70–99)
Phosphorus: 4.6 mg/dL (ref 2.5–4.6)
Potassium: 4.9 mmol/L (ref 3.5–5.1)
Sodium: 139 mmol/L (ref 135–145)

## 2023-08-25 LAB — CBC WITH DIFFERENTIAL/PLATELET
Abs Immature Granulocytes: 0.02 10*3/uL (ref 0.00–0.07)
Basophils Absolute: 0 10*3/uL (ref 0.0–0.1)
Basophils Relative: 0 %
Eosinophils Absolute: 0.3 10*3/uL (ref 0.0–0.5)
Eosinophils Relative: 6 %
HCT: 34.5 % — ABNORMAL LOW (ref 39.0–52.0)
Hemoglobin: 10.7 g/dL — ABNORMAL LOW (ref 13.0–17.0)
Immature Granulocytes: 0 %
Lymphocytes Relative: 23 %
Lymphs Abs: 1.2 10*3/uL (ref 0.7–4.0)
MCH: 24.8 pg — ABNORMAL LOW (ref 26.0–34.0)
MCHC: 31 g/dL (ref 30.0–36.0)
MCV: 79.9 fL — ABNORMAL LOW (ref 80.0–100.0)
Monocytes Absolute: 0.7 10*3/uL (ref 0.1–1.0)
Monocytes Relative: 13 %
Neutro Abs: 2.9 10*3/uL (ref 1.7–7.7)
Neutrophils Relative %: 58 %
Platelets: 194 10*3/uL (ref 150–400)
RBC: 4.32 MIL/uL (ref 4.22–5.81)
RDW: 17.2 % — ABNORMAL HIGH (ref 11.5–15.5)
WBC: 5.1 10*3/uL (ref 4.0–10.5)
nRBC: 0 % (ref 0.0–0.2)

## 2023-08-25 LAB — RENAL FUNCTION PANEL
Albumin: 2.4 g/dL — ABNORMAL LOW (ref 3.5–5.0)
Anion gap: 11 (ref 5–15)
BUN: 51 mg/dL — ABNORMAL HIGH (ref 8–23)
CO2: 27 mmol/L (ref 22–32)
Calcium: 9.3 mg/dL (ref 8.9–10.3)
Chloride: 100 mmol/L (ref 98–111)
Creatinine, Ser: 3.25 mg/dL — ABNORMAL HIGH (ref 0.61–1.24)
GFR, Estimated: 18 mL/min — ABNORMAL LOW (ref >60)
Glucose, Bld: 129 mg/dL — ABNORMAL HIGH (ref 70–99)
Phosphorus: 4.5 mg/dL (ref 2.5–4.6)
Potassium: 5.1 mmol/L (ref 3.5–5.1)
Sodium: 138 mmol/L (ref 135–145)

## 2023-08-28 LAB — CBC WITH DIFFERENTIAL/PLATELET
Abs Immature Granulocytes: 0.01 10*3/uL (ref 0.00–0.07)
Basophils Absolute: 0 10*3/uL (ref 0.0–0.1)
Basophils Relative: 0 %
Eosinophils Absolute: 0.4 10*3/uL (ref 0.0–0.5)
Eosinophils Relative: 7 %
HCT: 30.9 % — ABNORMAL LOW (ref 39.0–52.0)
Hemoglobin: 9.6 g/dL — ABNORMAL LOW (ref 13.0–17.0)
Immature Granulocytes: 0 %
Lymphocytes Relative: 25 %
Lymphs Abs: 1.4 10*3/uL (ref 0.7–4.0)
MCH: 24.7 pg — ABNORMAL LOW (ref 26.0–34.0)
MCHC: 31.1 g/dL (ref 30.0–36.0)
MCV: 79.6 fL — ABNORMAL LOW (ref 80.0–100.0)
Monocytes Absolute: 0.8 10*3/uL (ref 0.1–1.0)
Monocytes Relative: 15 %
Neutro Abs: 2.9 10*3/uL (ref 1.7–7.7)
Neutrophils Relative %: 53 %
Platelets: 174 10*3/uL (ref 150–400)
RBC: 3.88 MIL/uL — ABNORMAL LOW (ref 4.22–5.81)
RDW: 17.3 % — ABNORMAL HIGH (ref 11.5–15.5)
WBC: 5.6 10*3/uL (ref 4.0–10.5)
nRBC: 0 % (ref 0.0–0.2)

## 2023-08-28 LAB — RENAL FUNCTION PANEL
Albumin: 2.3 g/dL — ABNORMAL LOW (ref 3.5–5.0)
Anion gap: 9 (ref 5–15)
BUN: 67 mg/dL — ABNORMAL HIGH (ref 8–23)
CO2: 27 mmol/L (ref 22–32)
Calcium: 8.9 mg/dL (ref 8.9–10.3)
Chloride: 101 mmol/L (ref 98–111)
Creatinine, Ser: 3.74 mg/dL — ABNORMAL HIGH (ref 0.61–1.24)
GFR, Estimated: 15 mL/min — ABNORMAL LOW (ref >60)
Glucose, Bld: 96 mg/dL (ref 70–99)
Phosphorus: 4.8 mg/dL — ABNORMAL HIGH (ref 2.5–4.6)
Potassium: 4.7 mmol/L (ref 3.5–5.1)
Sodium: 137 mmol/L (ref 135–145)

## 2023-08-30 LAB — CBC WITH DIFFERENTIAL/PLATELET
Abs Immature Granulocytes: 0.02 10*3/uL (ref 0.00–0.07)
Basophils Absolute: 0 10*3/uL (ref 0.0–0.1)
Basophils Relative: 0 %
Eosinophils Absolute: 0.4 10*3/uL (ref 0.0–0.5)
Eosinophils Relative: 7 %
HCT: 33.7 % — ABNORMAL LOW (ref 39.0–52.0)
Hemoglobin: 10.3 g/dL — ABNORMAL LOW (ref 13.0–17.0)
Immature Granulocytes: 0 %
Lymphocytes Relative: 25 %
Lymphs Abs: 1.5 10*3/uL (ref 0.7–4.0)
MCH: 24.3 pg — ABNORMAL LOW (ref 26.0–34.0)
MCHC: 30.6 g/dL (ref 30.0–36.0)
MCV: 79.5 fL — ABNORMAL LOW (ref 80.0–100.0)
Monocytes Absolute: 0.8 10*3/uL (ref 0.1–1.0)
Monocytes Relative: 14 %
Neutro Abs: 3.1 10*3/uL (ref 1.7–7.7)
Neutrophils Relative %: 54 %
Platelets: 184 10*3/uL (ref 150–400)
RBC: 4.24 MIL/uL (ref 4.22–5.81)
RDW: 17.2 % — ABNORMAL HIGH (ref 11.5–15.5)
WBC: 5.8 10*3/uL (ref 4.0–10.5)
nRBC: 0 % (ref 0.0–0.2)

## 2023-08-30 LAB — RENAL FUNCTION PANEL
Albumin: 2.5 g/dL — ABNORMAL LOW (ref 3.5–5.0)
Anion gap: 13 (ref 5–15)
BUN: 52 mg/dL — ABNORMAL HIGH (ref 8–23)
CO2: 27 mmol/L (ref 22–32)
Calcium: 9.2 mg/dL (ref 8.9–10.3)
Chloride: 95 mmol/L — ABNORMAL LOW (ref 98–111)
Creatinine, Ser: 3.21 mg/dL — ABNORMAL HIGH (ref 0.61–1.24)
GFR, Estimated: 18 mL/min — ABNORMAL LOW (ref >60)
Glucose, Bld: 121 mg/dL — ABNORMAL HIGH (ref 70–99)
Phosphorus: 4.2 mg/dL (ref 2.5–4.6)
Potassium: 4.5 mmol/L (ref 3.5–5.1)
Sodium: 135 mmol/L (ref 135–145)

## 2023-09-01 LAB — CBC WITH DIFFERENTIAL/PLATELET
Abs Immature Granulocytes: 0.02 10*3/uL (ref 0.00–0.07)
Basophils Absolute: 0 10*3/uL (ref 0.0–0.1)
Basophils Relative: 0 %
Eosinophils Absolute: 0.4 10*3/uL (ref 0.0–0.5)
Eosinophils Relative: 6 %
HCT: 38.2 % — ABNORMAL LOW (ref 39.0–52.0)
Hemoglobin: 11.9 g/dL — ABNORMAL LOW (ref 13.0–17.0)
Immature Granulocytes: 0 %
Lymphocytes Relative: 30 %
Lymphs Abs: 1.9 10*3/uL (ref 0.7–4.0)
MCH: 25 pg — ABNORMAL LOW (ref 26.0–34.0)
MCHC: 31.2 g/dL (ref 30.0–36.0)
MCV: 80.3 fL (ref 80.0–100.0)
Monocytes Absolute: 0.9 10*3/uL (ref 0.1–1.0)
Monocytes Relative: 15 %
Neutro Abs: 3.1 10*3/uL (ref 1.7–7.7)
Neutrophils Relative %: 49 %
Platelets: 183 10*3/uL (ref 150–400)
RBC: 4.76 MIL/uL (ref 4.22–5.81)
RDW: 17.6 % — ABNORMAL HIGH (ref 11.5–15.5)
WBC: 6.3 10*3/uL (ref 4.0–10.5)
nRBC: 0 % (ref 0.0–0.2)

## 2023-09-01 LAB — RENAL FUNCTION PANEL
Albumin: 2.7 g/dL — ABNORMAL LOW (ref 3.5–5.0)
Anion gap: 11 (ref 5–15)
BUN: 60 mg/dL — ABNORMAL HIGH (ref 8–23)
CO2: 28 mmol/L (ref 22–32)
Calcium: 9.8 mg/dL (ref 8.9–10.3)
Chloride: 98 mmol/L (ref 98–111)
Creatinine, Ser: 3.44 mg/dL — ABNORMAL HIGH (ref 0.61–1.24)
GFR, Estimated: 17 mL/min — ABNORMAL LOW (ref >60)
Glucose, Bld: 120 mg/dL — ABNORMAL HIGH (ref 70–99)
Phosphorus: 4 mg/dL (ref 2.5–4.6)
Potassium: 5.2 mmol/L — ABNORMAL HIGH (ref 3.5–5.1)
Sodium: 137 mmol/L (ref 135–145)

## 2023-09-03 LAB — RENAL FUNCTION PANEL
Albumin: 2.5 g/dL — ABNORMAL LOW (ref 3.5–5.0)
Anion gap: 11 (ref 5–15)
BUN: 64 mg/dL — ABNORMAL HIGH (ref 8–23)
CO2: 27 mmol/L (ref 22–32)
Calcium: 9.4 mg/dL (ref 8.9–10.3)
Chloride: 98 mmol/L (ref 98–111)
Creatinine, Ser: 3.34 mg/dL — ABNORMAL HIGH (ref 0.61–1.24)
GFR, Estimated: 18 mL/min — ABNORMAL LOW (ref >60)
Glucose, Bld: 117 mg/dL — ABNORMAL HIGH (ref 70–99)
Phosphorus: 4.9 mg/dL — ABNORMAL HIGH (ref 2.5–4.6)
Potassium: 5.2 mmol/L — ABNORMAL HIGH (ref 3.5–5.1)
Sodium: 136 mmol/L (ref 135–145)

## 2023-09-03 LAB — CBC WITH DIFFERENTIAL/PLATELET
Abs Immature Granulocytes: 0.01 10*3/uL (ref 0.00–0.07)
Basophils Absolute: 0 10*3/uL (ref 0.0–0.1)
Basophils Relative: 0 %
Eosinophils Absolute: 0.2 10*3/uL (ref 0.0–0.5)
Eosinophils Relative: 4 %
HCT: 35.4 % — ABNORMAL LOW (ref 39.0–52.0)
Hemoglobin: 10.8 g/dL — ABNORMAL LOW (ref 13.0–17.0)
Immature Granulocytes: 0 %
Lymphocytes Relative: 28 %
Lymphs Abs: 1.2 10*3/uL (ref 0.7–4.0)
MCH: 24.4 pg — ABNORMAL LOW (ref 26.0–34.0)
MCHC: 30.5 g/dL (ref 30.0–36.0)
MCV: 79.9 fL — ABNORMAL LOW (ref 80.0–100.0)
Monocytes Absolute: 0.6 10*3/uL (ref 0.1–1.0)
Monocytes Relative: 14 %
Neutro Abs: 2.3 10*3/uL (ref 1.7–7.7)
Neutrophils Relative %: 54 %
Platelets: 178 10*3/uL (ref 150–400)
RBC: 4.43 MIL/uL (ref 4.22–5.81)
RDW: 17.7 % — ABNORMAL HIGH (ref 11.5–15.5)
WBC: 4.3 10*3/uL (ref 4.0–10.5)
nRBC: 0 % (ref 0.0–0.2)

## 2023-09-05 LAB — RENAL FUNCTION PANEL
Albumin: 2.6 g/dL — ABNORMAL LOW (ref 3.5–5.0)
Anion gap: 12 (ref 5–15)
BUN: 54 mg/dL — ABNORMAL HIGH (ref 8–23)
CO2: 25 mmol/L (ref 22–32)
Calcium: 9.3 mg/dL (ref 8.9–10.3)
Chloride: 98 mmol/L (ref 98–111)
Creatinine, Ser: 2.85 mg/dL — ABNORMAL HIGH (ref 0.61–1.24)
GFR, Estimated: 21 mL/min — ABNORMAL LOW (ref >60)
Glucose, Bld: 103 mg/dL — ABNORMAL HIGH (ref 70–99)
Phosphorus: 3.8 mg/dL (ref 2.5–4.6)
Potassium: 5 mmol/L (ref 3.5–5.1)
Sodium: 135 mmol/L (ref 135–145)

## 2023-09-05 LAB — CBC WITH DIFFERENTIAL/PLATELET
Abs Immature Granulocytes: 0.01 10*3/uL (ref 0.00–0.07)
Basophils Absolute: 0 10*3/uL (ref 0.0–0.1)
Basophils Relative: 0 %
Eosinophils Absolute: 0.3 10*3/uL (ref 0.0–0.5)
Eosinophils Relative: 6 %
HCT: 37 % — ABNORMAL LOW (ref 39.0–52.0)
Hemoglobin: 11.3 g/dL — ABNORMAL LOW (ref 13.0–17.0)
Immature Granulocytes: 0 %
Lymphocytes Relative: 24 %
Lymphs Abs: 1.2 10*3/uL (ref 0.7–4.0)
MCH: 24.5 pg — ABNORMAL LOW (ref 26.0–34.0)
MCHC: 30.5 g/dL (ref 30.0–36.0)
MCV: 80.1 fL (ref 80.0–100.0)
Monocytes Absolute: 0.8 10*3/uL (ref 0.1–1.0)
Monocytes Relative: 15 %
Neutro Abs: 2.8 10*3/uL (ref 1.7–7.7)
Neutrophils Relative %: 55 %
Platelets: 112 10*3/uL — ABNORMAL LOW (ref 150–400)
RBC: 4.62 MIL/uL (ref 4.22–5.81)
RDW: 18.1 % — ABNORMAL HIGH (ref 11.5–15.5)
WBC: 5.1 10*3/uL (ref 4.0–10.5)
nRBC: 0 % (ref 0.0–0.2)

## 2023-09-07 LAB — RENAL FUNCTION PANEL
Albumin: 2.5 g/dL — ABNORMAL LOW (ref 3.5–5.0)
Anion gap: 10 (ref 5–15)
BUN: 57 mg/dL — ABNORMAL HIGH (ref 8–23)
CO2: 29 mmol/L (ref 22–32)
Calcium: 9.3 mg/dL (ref 8.9–10.3)
Chloride: 97 mmol/L — ABNORMAL LOW (ref 98–111)
Creatinine, Ser: 3.38 mg/dL — ABNORMAL HIGH (ref 0.61–1.24)
GFR, Estimated: 17 mL/min — ABNORMAL LOW (ref >60)
Glucose, Bld: 103 mg/dL — ABNORMAL HIGH (ref 70–99)
Phosphorus: 4.3 mg/dL (ref 2.5–4.6)
Potassium: 4.3 mmol/L (ref 3.5–5.1)
Sodium: 136 mmol/L (ref 135–145)

## 2023-09-07 LAB — CBC WITH DIFFERENTIAL/PLATELET
Abs Immature Granulocytes: 0.03 10*3/uL (ref 0.00–0.07)
Basophils Absolute: 0 10*3/uL (ref 0.0–0.1)
Basophils Relative: 0 %
Eosinophils Absolute: 0.3 10*3/uL (ref 0.0–0.5)
Eosinophils Relative: 4 %
HCT: 34.6 % — ABNORMAL LOW (ref 39.0–52.0)
Hemoglobin: 10.7 g/dL — ABNORMAL LOW (ref 13.0–17.0)
Immature Granulocytes: 0 %
Lymphocytes Relative: 21 %
Lymphs Abs: 1.4 10*3/uL (ref 0.7–4.0)
MCH: 24.7 pg — ABNORMAL LOW (ref 26.0–34.0)
MCHC: 30.9 g/dL (ref 30.0–36.0)
MCV: 79.7 fL — ABNORMAL LOW (ref 80.0–100.0)
Monocytes Absolute: 0.9 10*3/uL (ref 0.1–1.0)
Monocytes Relative: 14 %
Neutro Abs: 4 10*3/uL (ref 1.7–7.7)
Neutrophils Relative %: 61 %
Platelets: 165 10*3/uL (ref 150–400)
RBC: 4.34 MIL/uL (ref 4.22–5.81)
RDW: 18 % — ABNORMAL HIGH (ref 11.5–15.5)
WBC: 6.7 10*3/uL (ref 4.0–10.5)
nRBC: 0 % (ref 0.0–0.2)

## 2023-09-10 LAB — CBC WITH DIFFERENTIAL/PLATELET
Abs Immature Granulocytes: 0.02 10*3/uL (ref 0.00–0.07)
Basophils Absolute: 0 10*3/uL (ref 0.0–0.1)
Basophils Relative: 0 %
Eosinophils Absolute: 0.2 10*3/uL (ref 0.0–0.5)
Eosinophils Relative: 4 %
HCT: 33 % — ABNORMAL LOW (ref 39.0–52.0)
Hemoglobin: 10.3 g/dL — ABNORMAL LOW (ref 13.0–17.0)
Immature Granulocytes: 0 %
Lymphocytes Relative: 26 %
Lymphs Abs: 1.5 10*3/uL (ref 0.7–4.0)
MCH: 25.1 pg — ABNORMAL LOW (ref 26.0–34.0)
MCHC: 31.2 g/dL (ref 30.0–36.0)
MCV: 80.3 fL (ref 80.0–100.0)
Monocytes Absolute: 0.8 10*3/uL (ref 0.1–1.0)
Monocytes Relative: 13 %
Neutro Abs: 3.2 10*3/uL (ref 1.7–7.7)
Neutrophils Relative %: 57 %
Platelets: 174 10*3/uL (ref 150–400)
RBC: 4.11 MIL/uL — ABNORMAL LOW (ref 4.22–5.81)
RDW: 18.6 % — ABNORMAL HIGH (ref 11.5–15.5)
WBC: 5.6 10*3/uL (ref 4.0–10.5)
nRBC: 0 % (ref 0.0–0.2)

## 2023-09-10 LAB — RENAL FUNCTION PANEL
Albumin: 2.3 g/dL — ABNORMAL LOW (ref 3.5–5.0)
Anion gap: 9 (ref 5–15)
BUN: 67 mg/dL — ABNORMAL HIGH (ref 8–23)
CO2: 26 mmol/L (ref 22–32)
Calcium: 9 mg/dL (ref 8.9–10.3)
Chloride: 102 mmol/L (ref 98–111)
Creatinine, Ser: 2.91 mg/dL — ABNORMAL HIGH (ref 0.61–1.24)
GFR, Estimated: 21 mL/min — ABNORMAL LOW (ref >60)
Glucose, Bld: 102 mg/dL — ABNORMAL HIGH (ref 70–99)
Phosphorus: 4.4 mg/dL (ref 2.5–4.6)
Potassium: 4.5 mmol/L (ref 3.5–5.1)
Sodium: 137 mmol/L (ref 135–145)

## 2023-09-11 LAB — BASIC METABOLIC PANEL WITH GFR
Anion gap: 8 (ref 5–15)
BUN: 35 mg/dL — ABNORMAL HIGH (ref 8–23)
CO2: 28 mmol/L (ref 22–32)
Calcium: 8.6 mg/dL — ABNORMAL LOW (ref 8.9–10.3)
Chloride: 101 mmol/L (ref 98–111)
Creatinine, Ser: 2.2 mg/dL — ABNORMAL HIGH (ref 0.61–1.24)
GFR, Estimated: 29 mL/min — ABNORMAL LOW (ref >60)
Glucose, Bld: 114 mg/dL — ABNORMAL HIGH (ref 70–99)
Potassium: 4.7 mmol/L (ref 3.5–5.1)
Sodium: 137 mmol/L (ref 135–145)

## 2023-09-11 LAB — HEPATITIS B SURFACE ANTIGEN: Hepatitis B Surface Ag: NONREACTIVE

## 2023-09-11 LAB — MAGNESIUM: Magnesium: 2 mg/dL (ref 1.7–2.4)

## 2023-09-12 LAB — RENAL FUNCTION PANEL
Albumin: 2.3 g/dL — ABNORMAL LOW (ref 3.5–5.0)
Anion gap: 8 (ref 5–15)
BUN: 54 mg/dL — ABNORMAL HIGH (ref 8–23)
CO2: 26 mmol/L (ref 22–32)
Calcium: 8.8 mg/dL — ABNORMAL LOW (ref 8.9–10.3)
Chloride: 100 mmol/L (ref 98–111)
Creatinine, Ser: 2.89 mg/dL — ABNORMAL HIGH (ref 0.61–1.24)
GFR, Estimated: 21 mL/min — ABNORMAL LOW (ref >60)
Glucose, Bld: 120 mg/dL — ABNORMAL HIGH (ref 70–99)
Phosphorus: 4.5 mg/dL (ref 2.5–4.6)
Potassium: 5.1 mmol/L (ref 3.5–5.1)
Sodium: 134 mmol/L — ABNORMAL LOW (ref 135–145)

## 2023-09-12 LAB — CBC WITH DIFFERENTIAL/PLATELET
Abs Immature Granulocytes: 0.02 10*3/uL (ref 0.00–0.07)
Basophils Absolute: 0 10*3/uL (ref 0.0–0.1)
Basophils Relative: 0 %
Eosinophils Absolute: 0.3 10*3/uL (ref 0.0–0.5)
Eosinophils Relative: 6 %
HCT: 32.2 % — ABNORMAL LOW (ref 39.0–52.0)
Hemoglobin: 10.2 g/dL — ABNORMAL LOW (ref 13.0–17.0)
Immature Granulocytes: 0 %
Lymphocytes Relative: 19 %
Lymphs Abs: 1.2 10*3/uL (ref 0.7–4.0)
MCH: 25.1 pg — ABNORMAL LOW (ref 26.0–34.0)
MCHC: 31.7 g/dL (ref 30.0–36.0)
MCV: 79.1 fL — ABNORMAL LOW (ref 80.0–100.0)
Monocytes Absolute: 0.8 10*3/uL (ref 0.1–1.0)
Monocytes Relative: 13 %
Neutro Abs: 3.7 10*3/uL (ref 1.7–7.7)
Neutrophils Relative %: 62 %
Platelets: 163 10*3/uL (ref 150–400)
RBC: 4.07 MIL/uL — ABNORMAL LOW (ref 4.22–5.81)
RDW: 18.6 % — ABNORMAL HIGH (ref 11.5–15.5)
WBC: 6 10*3/uL (ref 4.0–10.5)
nRBC: 0 % (ref 0.0–0.2)

## 2023-09-14 LAB — CBC WITH DIFFERENTIAL/PLATELET
Abs Immature Granulocytes: 0.03 10*3/uL (ref 0.00–0.07)
Basophils Absolute: 0 10*3/uL (ref 0.0–0.1)
Basophils Relative: 0 %
Eosinophils Absolute: 0.5 10*3/uL (ref 0.0–0.5)
Eosinophils Relative: 9 %
HCT: 35.1 % — ABNORMAL LOW (ref 39.0–52.0)
Hemoglobin: 10.8 g/dL — ABNORMAL LOW (ref 13.0–17.0)
Immature Granulocytes: 1 %
Lymphocytes Relative: 19 %
Lymphs Abs: 1.2 10*3/uL (ref 0.7–4.0)
MCH: 25.1 pg — ABNORMAL LOW (ref 26.0–34.0)
MCHC: 30.8 g/dL (ref 30.0–36.0)
MCV: 81.6 fL (ref 80.0–100.0)
Monocytes Absolute: 0.9 10*3/uL (ref 0.1–1.0)
Monocytes Relative: 14 %
Neutro Abs: 3.6 10*3/uL (ref 1.7–7.7)
Neutrophils Relative %: 57 %
Platelets: 147 10*3/uL — ABNORMAL LOW (ref 150–400)
RBC: 4.3 MIL/uL (ref 4.22–5.81)
RDW: 19.3 % — ABNORMAL HIGH (ref 11.5–15.5)
WBC: 6.3 10*3/uL (ref 4.0–10.5)
nRBC: 0 % (ref 0.0–0.2)

## 2023-09-14 LAB — RENAL FUNCTION PANEL
Albumin: 2.2 g/dL — ABNORMAL LOW (ref 3.5–5.0)
Anion gap: 9 (ref 5–15)
BUN: 51 mg/dL — ABNORMAL HIGH (ref 8–23)
CO2: 26 mmol/L (ref 22–32)
Calcium: 9.2 mg/dL (ref 8.9–10.3)
Chloride: 100 mmol/L (ref 98–111)
Creatinine, Ser: 2.63 mg/dL — ABNORMAL HIGH (ref 0.61–1.24)
GFR, Estimated: 23 mL/min — ABNORMAL LOW (ref >60)
Glucose, Bld: 128 mg/dL — ABNORMAL HIGH (ref 70–99)
Phosphorus: 4.4 mg/dL (ref 2.5–4.6)
Potassium: 5.5 mmol/L — ABNORMAL HIGH (ref 3.5–5.1)
Sodium: 135 mmol/L (ref 135–145)

## 2023-09-15 LAB — BASIC METABOLIC PANEL WITH GFR
Anion gap: 9 (ref 5–15)
BUN: 34 mg/dL — ABNORMAL HIGH (ref 8–23)
CO2: 29 mmol/L (ref 22–32)
Calcium: 8.7 mg/dL — ABNORMAL LOW (ref 8.9–10.3)
Chloride: 97 mmol/L — ABNORMAL LOW (ref 98–111)
Creatinine, Ser: 2.19 mg/dL — ABNORMAL HIGH (ref 0.61–1.24)
GFR, Estimated: 29 mL/min — ABNORMAL LOW (ref >60)
Glucose, Bld: 97 mg/dL (ref 70–99)
Potassium: 4.4 mmol/L (ref 3.5–5.1)
Sodium: 135 mmol/L (ref 135–145)

## 2023-09-17 LAB — CBC WITH DIFFERENTIAL/PLATELET
Abs Immature Granulocytes: 0.01 10*3/uL (ref 0.00–0.07)
Basophils Absolute: 0 10*3/uL (ref 0.0–0.1)
Basophils Relative: 0 %
Eosinophils Absolute: 0.6 10*3/uL — ABNORMAL HIGH (ref 0.0–0.5)
Eosinophils Relative: 9 %
HCT: 31.1 % — ABNORMAL LOW (ref 39.0–52.0)
Hemoglobin: 9.8 g/dL — ABNORMAL LOW (ref 13.0–17.0)
Immature Granulocytes: 0 %
Lymphocytes Relative: 23 %
Lymphs Abs: 1.5 10*3/uL (ref 0.7–4.0)
MCH: 25.5 pg — ABNORMAL LOW (ref 26.0–34.0)
MCHC: 31.5 g/dL (ref 30.0–36.0)
MCV: 81 fL (ref 80.0–100.0)
Monocytes Absolute: 1 10*3/uL (ref 0.1–1.0)
Monocytes Relative: 16 %
Neutro Abs: 3.3 10*3/uL (ref 1.7–7.7)
Neutrophils Relative %: 52 %
Platelets: 158 10*3/uL (ref 150–400)
RBC: 3.84 MIL/uL — ABNORMAL LOW (ref 4.22–5.81)
RDW: 19.5 % — ABNORMAL HIGH (ref 11.5–15.5)
WBC: 6.3 10*3/uL (ref 4.0–10.5)
nRBC: 0 % (ref 0.0–0.2)

## 2023-09-17 LAB — RENAL FUNCTION PANEL
Albumin: 2.2 g/dL — ABNORMAL LOW (ref 3.5–5.0)
Anion gap: 11 (ref 5–15)
BUN: 54 mg/dL — ABNORMAL HIGH (ref 8–23)
CO2: 24 mmol/L (ref 22–32)
Calcium: 8.9 mg/dL (ref 8.9–10.3)
Chloride: 102 mmol/L (ref 98–111)
Creatinine, Ser: 2.94 mg/dL — ABNORMAL HIGH (ref 0.61–1.24)
GFR, Estimated: 20 mL/min — ABNORMAL LOW (ref >60)
Glucose, Bld: 73 mg/dL (ref 70–99)
Phosphorus: 5.3 mg/dL — ABNORMAL HIGH (ref 2.5–4.6)
Potassium: 5 mmol/L (ref 3.5–5.1)
Sodium: 137 mmol/L (ref 135–145)

## 2023-09-19 ENCOUNTER — Emergency Department
Admission: EM | Admit: 2023-09-19 | Discharge: 2023-09-19 | Disposition: A | Discharge status: Skilled Nursing Facility | Attending: Emergency Medicine | Admitting: Emergency Medicine

## 2023-09-19 ENCOUNTER — Other Ambulatory Visit: Payer: Self-pay

## 2023-09-19 ENCOUNTER — Emergency Department

## 2023-09-19 DIAGNOSIS — Z7982 Long term (current) use of aspirin: Secondary | ICD-10-CM | POA: Insufficient documentation

## 2023-09-19 DIAGNOSIS — F0390 Unspecified dementia without behavioral disturbance: Secondary | ICD-10-CM | POA: Insufficient documentation

## 2023-09-19 DIAGNOSIS — Y732 Prosthetic and other implants, materials and accessory gastroenterology and urology devices associated with adverse incidents: Secondary | ICD-10-CM | POA: Diagnosis not present

## 2023-09-19 DIAGNOSIS — N186 End stage renal disease: Secondary | ICD-10-CM | POA: Diagnosis not present

## 2023-09-19 DIAGNOSIS — T85528A Displacement of other gastrointestinal prosthetic devices, implants and grafts, initial encounter: Secondary | ICD-10-CM | POA: Diagnosis present

## 2023-09-19 DIAGNOSIS — I132 Hypertensive heart and chronic kidney disease with heart failure and with stage 5 chronic kidney disease, or end stage renal disease: Secondary | ICD-10-CM | POA: Insufficient documentation

## 2023-09-19 DIAGNOSIS — I5030 Unspecified diastolic (congestive) heart failure: Secondary | ICD-10-CM | POA: Insufficient documentation

## 2023-09-19 DIAGNOSIS — Z992 Dependence on renal dialysis: Secondary | ICD-10-CM | POA: Insufficient documentation

## 2023-09-19 DIAGNOSIS — Z79899 Other long term (current) drug therapy: Secondary | ICD-10-CM | POA: Insufficient documentation

## 2023-09-19 MED ORDER — DIATRIZOATE MEGLUMINE & SODIUM 66-10 % PO SOLN
30.0000 mL | Freq: Once | ORAL | Status: AC
  Administered 2023-09-19: 30 mL
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
## 2023-09-19 NOTE — ED Notes (Signed)
 [facility] called for transport to [facility].

## 2023-09-19 NOTE — ED Triage Notes (Signed)
 Pt arrives via EMS from [facility] for g-tube replacement. EMS reports it was pulled out at 0900 yesterday morning.

## 2023-09-19 NOTE — ED Notes (Addendum)
 Pt incontinent of bladder. Pericare provided and brief changed. Pt tolerated well. Fall risk bracelet applied along with socks.

## 2023-12-05 ENCOUNTER — Telehealth (INDEPENDENT_AMBULATORY_CARE_PROVIDER_SITE_OTHER): Payer: Self-pay

## 2023-12-05 NOTE — Telephone Encounter (Signed)
 Spoke with Toshia at [facility] and the patient is scheduled with Dr. Marea for a permcath removal on 12/10/23 with a 1:15 pm arrival time to the [facility]. Pre-procedure instructions were discussed and will be faxed to attention Toshia at [facility].

## 2023-12-10 DIAGNOSIS — N186 End stage renal disease: Secondary | ICD-10-CM

## 2023-12-11 ENCOUNTER — Telehealth (INDEPENDENT_AMBULATORY_CARE_PROVIDER_SITE_OTHER): Payer: Self-pay

## 2023-12-11 NOTE — Telephone Encounter (Signed)
 Bertell Baptist from [facility] called to reschedule the patient for his permcath removal from 12/10/23 as the patient missed his appt. Patient has been rescheduled to 12/17/23 with a 2:15 pm arrival time to the [facility].

## 2023-12-13 NOTE — H&P (View-Only) (Signed)
 Patient Name: Travis Mcconnell, male   Patient DOB: 1941/05/08 Date of Service: 12/13/2023  Patient MRN: [id number] Provider Creating Note: Saralee Stank, MD  [phone] Primary Care Physician: No primary care provider on file.  [address] [place] [address] Additional Physicians/ Providers:   Chief Complaint   Chief Complaint  Patient presents with  . Follow-up    History of Present Illness Travis Mcconnell is a 83 y.o. 2-Black or African American male with medical problems of dementia, AKI, diastolic CHF, hypertension, failure to thrive/malnutrition requiring G-tube in January 2025.  [facility] from May 18, 2023 till September 17, 2023.  Prior to that he was admitted to [facility] in [place] from December 24 till December 29 for volume overload and renal failure.  Admission was complicated by acute hypoxic respiratory failure, anemia, acute kidney injury requiring hemodialysis, hospital-acquired pneumonia, ESBL Klebsiella bacteremia, congestive heart failure EF 40 to 45%, SVT, pulmonary nodule, history of stroke, history of urinary retention, hematuria with psoas muscle hemorrhage, dementia and generalized weakness.  He was discharged to a skilled nursing facility and required outpatient dialysis for some time.  His urine output improved therefore dialysis was held and he is now presenting for follow-up after hemodialysis has been held for about a week or so.  His last dialysis treatment was on 11/10/2023.  He has been off dialysis for about 12 days.  Labs from 11/19/2023 were reviewed.  Creatinine of 2.1, BUN 24, GFR 31, albumin 3.3, potassium 4.6  Today he presents to the office with a caretaker (Ms Mitzie) from [facility].  Overall he is doing fair.  No shortness of breath.  Reports that his appetite is okay.  no edema.  They missed appointment for HD catheter removal due to transportation mixup.  Vitals BP 149/81 (BP Location: Right upper  arm, Patient Position: Sitting)   Pulse 61   Temp 98.1 F   SpO2 93%  Physical Exam  General Appearance-frail elderly gentleman, sitting in the wheelchair HEENT-moist oral mucous membranes Pulmonary-normal breathing effort on room air, clear to auscultation bilaterally Cardiac-irregular rhythm Abdomen-soft, nontender, nondistended Extremities-no edema Neuro-alert and oriented Dialysis access-right IJ tunneled dialysis catheter in place.   Medications   Current Outpatient Medications:  .  midodrine (PROAMATINE) 10 MG tablet, 10 mg by Per PEG Tube route 1 (one) time each day Hold if SBP >120, Disp: , Rfl:  .  Acidophilus Lactobacillus capsule, 1 each by Tube route in the morning and 1 each in the evening., Disp: , Rfl:  .  amiodarone (PACERONE) 200 MG tablet, 200 mg by Per PEG Tube route in the morning., Disp: , Rfl:  .  atorvastatin  (LIPITOR) 80 MG tablet, Take 80 mg by mouth 1 (one) time each day, Disp: , Rfl:  .  Ferrous Sulfate  300 (60 Fe) MG/5ML solution, Take 5 mL by mouth in the morning and 5 mL in the evening., Disp: , Rfl:  .  folic acid (FOLVITE) 1 MG tablet, 1 mg by Per PEG Tube route in the morning., Disp: , Rfl:  .  metoprolol  tartrate 25 MG tablet, Take 12.5 mg by mouth in the morning and 12.5 mg in the evening., Disp: , Rfl:  .  omeprazole (PriLOSEC) 40 MG DR capsule, Take 40 mg by mouth in the morning., Disp: , Rfl:  .  QUEtiapine (SEROquel) 50 MG tablet, 50 mg by Per PEG Tube route in the morning and 50 mg in the evening., Disp: , Rfl:  .  tamsulosin (FLOMAX) 0.4 MG 24 hr capsule, 0.4 mg by Per PEG Tube route in the morning., Disp: , Rfl:    Allergies Tramadol, Other, and Magnesium  salicylate  Imaging and Other Studies  CLINICAL DATA:  Hydronephrosis on CT.  EXAM: RENAL / URINARY TRACT ULTRASOUND COMPLETE  COMPARISON:  CT 06/09/2023, noncontrast  FINDINGS: Right Kidney:  Renal measurements: 10.3 x 5.6 x 5.3 cm = volume: 161 mL. Small 1.2 cm anechoic cyst in  the upper pole. No hydronephrosis.  Left Kidney:  Renal measurements: 11.4 x 7.3 x 5.3 cm = volume: 233 mL. No hydronephrosis. Large anechoic cyst extending from the lower pole measuring 7 cm. Findings similar to comparison CT.  Bladder:  Foley catheter within collapsed bladder.  Other:  None.  IMPRESSION: 1. No hydronephrosis. 2. Bilateral benign renal cysts.  No change from comparison CT.   Electronically Signed   By: Jackquline Boxer M.D.   On: 06/15/2023 16:57  Laboratory Studies   11/19/2023-creatinine 2.1, GFR 31, potassium 4.6   Impression/Recommendations   Patient is a 83 y.o. 2-Black or African American male With dementia, diastolic CHF, hypertension, history of failure to thrive/malnutrition, [facility], hospital-acquired pneumonia, ESBL Klebsiella bacteremia, SVT, history of stroke, history of urinary retention, history of psoas muscle hemorrhage, AKI requiring dialysis  1. Acute kidney failure with tubular necrosis (HCC)   2. Chronic kidney disease stage 3B (HCC)   3. Essential hypertension    Patient had AKI due to sepsis and acute illness.  [facility] [place] [facility] 20[facility] till November 10, 2023.  His urine output has improved and renal function appears to have recovered some.   Most recent labs from 12/03/2023 showed creatinine of 2.05/GFR 32.  This may be his new baseline.  Plan: - Patient has appointment with vascular surgery for removal of tunneled dialysis catheter - Follow-up labs in about 3 months - Monitor blood pressure and volume status.  If he starts to develop fluid retention, we may need to start low-dose furosemide. - Blood pressure control has improved.  He may not need midodrine for too long.  No follow-ups on file.   Saralee Stank, MD [facility] [address] [place] [address] Ph: [phone] Fax: [phone] 12/13/2023  The following portions of the patient's chart were reviewed in this encounter and updated as appropriate:  Tobacco  Allergies  Meds  Problems  Med Hx  Surg Hx  Fam Hx       Orders Placed This Encounter  . CBC and Differential  . Uric Acid  . Renal Function Panel  . PTH, Intact  . Magnesium 

## 2023-12-17 ENCOUNTER — Ambulatory Visit
Admission: RE | Admit: 2023-12-17 | Discharge: 2023-12-17 | Disposition: A | Discharge status: Home / Self Care | Attending: Vascular Surgery | Admitting: Vascular Surgery

## 2023-12-17 ENCOUNTER — Encounter
Admission: RE | Disposition: A | Discharge status: Home / Self Care | Payer: Self-pay | Source: Home / Self Care | Attending: Vascular Surgery

## 2023-12-17 DIAGNOSIS — N186 End stage renal disease: Secondary | ICD-10-CM | POA: Diagnosis present

## 2023-12-17 DIAGNOSIS — Z452 Encounter for adjustment and management of vascular access device: Secondary | ICD-10-CM | POA: Diagnosis not present

## 2023-12-17 HISTORY — PX: DIALYSIS/PERMA CATHETER REMOVAL: CATH118289

## 2023-12-17 SURGERY — DIALYSIS/PERMA CATHETER REMOVAL
Anesthesia: LOCAL

## 2023-12-17 MED ORDER — LIDOCAINE-EPINEPHRINE (PF) 1 %-1:200000 IJ SOLN
INTRAMUSCULAR | Status: DC | PRN
  Administered 2023-12-17: 10 mL

## 2023-12-17 NOTE — Discharge Instructions (Signed)
 Tunneled Catheter Removal, Care After Refer to this sheet in the next few weeks. These instructions provide you with information about caring for yourself after your procedure. Your health care provider may also give you more specific instructions. Your treatment has been planned according to current medical practices, but problems sometimes occur. Call your health care provider if you have any problems or questions after your procedure. What can I expect after the procedure? After the procedure, it is common to have: Some mild redness, swelling, and pain around your catheter site.   Follow these instructions at home: Incision care  Check your removal site  every day for signs of infection. Check for: More redness, swelling, or pain. More fluid or blood. Warmth. Pus or a bad smell. Remove your dressing in 48hrs leave open to air  Activity  Return to your normal activities as told by your health care provider. Ask your health care provider what activities are safe for you. Do not lift anything that is heavier than 10 lb (4.5 kg) for 3 days  You may shower tomorrow  Contact a health care provider if: You have more fluid or blood coming from your removal site You have more redness, swelling, or pain at your incisions or around the area where your catheter was removed Your removal site feel warm to the touch. You feel unusually weak. You feel nauseous.. Get help right away if You have swelling in your arm, shoulder, neck, or face. You develop chest pain. You have difficulty breathing. You feel dizzy or light-headed. You have pus or a bad smell coming from your removal site You have a fever. You develop bleeding from your removal site, and your bleeding does not stop. This information is not intended to replace advice given to you by your health care provider. Make sure you discuss any questions you have with your health care provider. Document Released: 05/01/2012 Document Revised:  01/16/2016 Document Reviewed: 02/08/2015 Elsevier Interactive Patient Education  2017 [employer].

## 2023-12-17 NOTE — Op Note (Signed)
 Operative Note     Preoperative diagnosis:   1. ESRD with return of renal function  Postoperative diagnosis:  1. ESRD with return of renal function  Procedure:  Removal of right Permcath  Surgeon:  Selinda Gu, MD  Anesthesia:  Local  EBL:  Minimal  Indication for the Procedure:  The patient has had return of renal function and no longer needs their permcath.  This can be removed.  Risks and benefits are discussed and informed consent is obtained.  Description of the Procedure:  The patient's right neck, chest and existing catheter were sterilely prepped and draped. The area around the catheter was anesthetized copiously with 1% lidocaine . The catheter was dissected out with curved hemostats until the cuff was freed from the surrounding fibrous sheath. The fiber sheath was transected, and the catheter was then removed in its entirety using gentle traction. Pressure was held and sterile dressings were placed. The patient tolerated the procedure well and was taken to the recovery room in stable condition.     Selinda Gu  12/17/2023, 4:06 PM This note was created with Dragon Medical transcription system. Any errors in dictation are purely unintentional.

## 2023-12-17 NOTE — Interval H&P Note (Signed)
 History and Physical Interval Note:  12/17/2023 2:33 PM  [name]  has presented today for surgery, with the diagnosis of Perma Cath Removal   End Stage Renal [facility].  The various methods of treatment have been discussed with the patient and family. After consideration of risks, benefits and other options for treatment, the patient has consented to  Procedure(s): DIALYSIS/PERMA CATHETER REMOVAL (N/A) as a surgical intervention.  The patient's history has been reviewed, patient examined, no change in status, stable for surgery.  I have reviewed the patient's chart and labs.  Questions were answered to the patient's satisfaction.     An Schnabel

## 2023-12-18 ENCOUNTER — Encounter: Payer: Self-pay | Admitting: Vascular Surgery

## 2024-02-06 ENCOUNTER — Emergency Department
Admission: EM | Admit: 2024-02-06 | Discharge: 2024-02-06 | Disposition: A | Discharge status: Home / Self Care | Source: Skilled Nursing Facility | Attending: Emergency Medicine | Admitting: Emergency Medicine

## 2024-02-06 ENCOUNTER — Other Ambulatory Visit: Payer: Self-pay

## 2024-02-06 ENCOUNTER — Emergency Department

## 2024-02-06 DIAGNOSIS — I10 Essential (primary) hypertension: Secondary | ICD-10-CM | POA: Diagnosis not present

## 2024-02-06 DIAGNOSIS — N3000 Acute cystitis without hematuria: Secondary | ICD-10-CM | POA: Diagnosis not present

## 2024-02-06 DIAGNOSIS — W19XXXA Unspecified fall, initial encounter: Secondary | ICD-10-CM | POA: Diagnosis not present

## 2024-02-06 DIAGNOSIS — R001 Bradycardia, unspecified: Secondary | ICD-10-CM | POA: Insufficient documentation

## 2024-02-06 DIAGNOSIS — S0091XA Abrasion of unspecified part of head, initial encounter: Secondary | ICD-10-CM | POA: Diagnosis not present

## 2024-02-06 DIAGNOSIS — R531 Weakness: Secondary | ICD-10-CM

## 2024-02-06 DIAGNOSIS — S0990XA Unspecified injury of head, initial encounter: Secondary | ICD-10-CM | POA: Diagnosis present

## 2024-02-06 LAB — CBC WITH DIFFERENTIAL/PLATELET
Abs Immature Granulocytes: 0.01 K/uL (ref 0.00–0.07)
Basophils Absolute: 0 K/uL (ref 0.0–0.1)
Basophils Relative: 1 %
Eosinophils Absolute: 0.2 K/uL (ref 0.0–0.5)
Eosinophils Relative: 3 %
HCT: 37.8 % — ABNORMAL LOW (ref 39.0–52.0)
Hemoglobin: 12 g/dL — ABNORMAL LOW (ref 13.0–17.0)
Immature Granulocytes: 0 %
Lymphocytes Relative: 37 %
Lymphs Abs: 1.8 K/uL (ref 0.7–4.0)
MCH: 27.8 pg (ref 26.0–34.0)
MCHC: 31.7 g/dL (ref 30.0–36.0)
MCV: 87.5 fL (ref 80.0–100.0)
Monocytes Absolute: 0.6 K/uL (ref 0.1–1.0)
Monocytes Relative: 13 %
Neutro Abs: 2.2 K/uL (ref 1.7–7.7)
Neutrophils Relative %: 46 %
Platelets: 149 K/uL — ABNORMAL LOW (ref 150–400)
RBC: 4.32 MIL/uL (ref 4.22–5.81)
RDW: 14.5 % (ref 11.5–15.5)
WBC: 4.7 K/uL (ref 4.0–10.5)
nRBC: 0 % (ref 0.0–0.2)

## 2024-02-06 LAB — URINALYSIS, ROUTINE W REFLEX MICROSCOPIC
Bilirubin Urine: NEGATIVE
Glucose, UA: NEGATIVE mg/dL
Ketones, ur: NEGATIVE mg/dL
Nitrite: NEGATIVE
Protein, ur: NEGATIVE mg/dL
Specific Gravity, Urine: 1.012 (ref 1.005–1.030)
Squamous Epithelial / HPF: 0 /HPF (ref 0–5)
WBC, UA: >50 WBC/hpf (ref 0–5)
pH: 5 (ref 5.0–8.0)

## 2024-02-06 LAB — COMPREHENSIVE METABOLIC PANEL WITH GFR
ALT: 11 U/L (ref 0–44)
AST: 16 U/L (ref 15–41)
Albumin: 3.2 g/dL — ABNORMAL LOW (ref 3.5–5.0)
Alkaline Phosphatase: 107 U/L (ref 38–126)
Anion gap: 10 (ref 5–15)
BUN: 23 mg/dL (ref 8–23)
CO2: 22 mmol/L (ref 22–32)
Calcium: 8.8 mg/dL — ABNORMAL LOW (ref 8.9–10.3)
Chloride: 106 mmol/L (ref 98–111)
Creatinine, Ser: 1.88 mg/dL — ABNORMAL HIGH (ref 0.61–1.24)
GFR, Estimated: 35 mL/min — ABNORMAL LOW (ref >60)
Glucose, Bld: 75 mg/dL (ref 70–99)
Potassium: 3.7 mmol/L (ref 3.5–5.1)
Sodium: 138 mmol/L (ref 135–145)
Total Bilirubin: 1.2 mg/dL (ref 0.0–1.2)
Total Protein: 7.4 g/dL (ref 6.5–8.1)

## 2024-02-06 LAB — TROPONIN I (HIGH SENSITIVITY): Troponin I (High Sensitivity): 15 ng/L (ref <18)

## 2024-02-06 MED ORDER — SODIUM CHLORIDE 0.9 % IV SOLN
1.0000 g | INTRAVENOUS | Status: DC
  Administered 2024-02-06: 1 g via INTRAVENOUS
  Filled 2024-02-06: qty 10

## 2024-02-06 MED ORDER — CEFDINIR 300 MG PO CAPS: 300.0000 mg | ORAL_CAPSULE | Freq: Two times a day (BID) | ORAL | 0 refills | Status: AC

## 2024-02-06 NOTE — ED Provider Notes (Signed)
 ----------------------------------------- 3:24 PM on 02/06/2024 -----------------------------------------  Blood pressure 127/87, pulse (!) 59, temperature 98.2 F (36.8 C), temperature source Oral, resp. rate 17, weight 86.2 kg, SpO2 98%.  Assuming care from [facility], NP.  In short, Travis Mcconnell is a 83 y.o. male with a chief complaint of Fall .  Refer to the original H&P for additional details.  The current plan of care is to await urinalysis with plan to discharge home.  ____________________________________________    ED Results / Procedures / Treatments   Labs (all labs ordered are listed, but only abnormal results are displayed) Labs Reviewed  COMPREHENSIVE METABOLIC PANEL WITH GFR - Abnormal; Notable for the following components:      Result Value   Creatinine, Ser 1.88 (*)    Calcium  8.8 (*)    Albumin 3.2 (*)    GFR, Estimated 35 (*)    All other components within normal limits  CBC WITH DIFFERENTIAL/PLATELET - Abnormal; Notable for the following components:   Hemoglobin 12.0 (*)    HCT 37.8 (*)    Platelets 149 (*)    All other components within normal limits  URINALYSIS, ROUTINE W REFLEX MICROSCOPIC - Abnormal; Notable for the following components:   Color, Urine YELLOW (*)    APPearance HAZY (*)    Hgb urine dipstick SMALL (*)    Leukocytes,Ua MODERATE (*)    Bacteria, UA MANY (*)    All other components within normal limits  URINE CULTURE  TROPONIN I (HIGH SENSITIVITY)  TROPONIN I (HIGH SENSITIVITY)     EKG     RADIOLOGY  I personally viewed and evaluated these images as part of my medical decision making, as well as reviewing the written report by the radiologist.  ED Provider Interpretation: I independently reviewed and interpreted imaging and agree with radiologists findings.   CT Cervical Spine Wo Contrast Result Date: 02/06/2024 CLINICAL DATA:  Unwitnessed fall. EXAM: CT CERVICAL SPINE WITHOUT CONTRAST TECHNIQUE: Multidetector CT  imaging of the cervical spine was performed without intravenous contrast. Multiplanar CT image reconstructions were also generated. RADIATION DOSE REDUCTION: This exam was performed according to the departmental dose-optimization program which includes automated exposure control, adjustment of the mA and/or kV according to patient size and/or use of iterative reconstruction technique. COMPARISON:  September 10, 2020 FINDINGS: Alignment: There is reversal of the normal cervical spine lordosis. Skull base and vertebrae: No acute fracture. No primary bone lesion or focal pathologic process. Soft tissues and spinal canal: No prevertebral fluid or swelling. No visible canal hematoma. Disc levels: Moderate to marked severity multilevel endplate sclerosis, anterior osteophyte formation and posterior bony spurring are seen throughout all levels of the cervical spine. Moderate to marked severity multilevel intervertebral disc space narrowing is present at the levels of C3-C4, C4-C5, C5-C6 and C6-C7 Marked severity bilateral multilevel facet joint hypertrophy is noted. Upper chest: There is evidence of extensive emphysematous lung disease. A 4 mm partially calcified posterolateral right apical pulmonary nodule is noted. Other: None. IMPRESSION: 1. No acute fracture or subluxation of the cervical spine. 2. Moderate to marked severity multilevel degenerative changes throughout the cervical spine. 3. Extensive emphysematous lung disease. Electronically Signed   By: Suzen Dials M.D.   On: 02/06/2024 13:07   CT Head Wo Contrast Result Date: 02/06/2024 CLINICAL DATA:  Unwitnessed fall. EXAM: CT HEAD WITHOUT CONTRAST TECHNIQUE: Contiguous axial images were obtained from the base of the skull through the vertex without intravenous contrast. RADIATION DOSE REDUCTION: This exam was performed  according to the departmental dose-optimization program which includes automated exposure control, adjustment of the mA and/or kV according  to patient size and/or use of iterative reconstruction technique. COMPARISON:  September 10, 2020 FINDINGS: Brain: There is generalized cerebral atrophy with widening of the extra-axial spaces and ventricular dilatation. There are areas of decreased attenuation within the white matter tracts of the supratentorial brain, consistent with microvascular disease changes. A chronic left frontal lobe infarct is seen. Small, chronic bilateral basal ganglia lacunar infarcts are also noted. Vascular: Mild to moderate severity bilateral cavernous carotid artery calcification is noted. Skull: Normal. Negative for fracture or focal lesion. Sinuses/Orbits: No acute finding. Other: None. IMPRESSION: 1. Generalized cerebral atrophy with chronic white matter small vessel ischemic changes. 2. Chronic left frontal lobe infarct. 3. Small, chronic bilateral basal ganglia lacunar infarcts. 4. No acute intracranial abnormality. Electronically Signed   By: Suzen Dials M.D.   On: 02/06/2024 13:01     PROCEDURES:  Critical Care performed: No  Procedures   MEDICATIONS ORDERED IN ED: Medications  cefTRIAXone  (ROCEPHIN ) 1 g in sodium chloride  0.9 % 100 mL IVPB (1 g Intravenous New Bag/Given 02/06/24 1726)     IMPRESSION / MDM / ASSESSMENT AND PLAN / ED COURSE  I reviewed the triage vital signs and the nursing notes.                              Differential diagnosis includes, but is not limited to, urinary tract infection, unwitnessed fall, dementia, concussion, contusion, intracranial hemorrhage.  I reviewed the patient's chart.  Patient has had history of long hospitalizations.  He was admitted in [place] from December until April for volume overload and renal failure and his hospitalization was complicated by acute hypoxemic respiratory failure, anemia, acute kidney injury requiring dialysis, hospital-acquired pneumonia, ESBL Klebsiella bacteremia.  He has a history of generalized weakness and dementia.  He is at  a facility already.  He recently had removal of his PermCath given return of renal function.  He is reportedly at his baseline per the primary provider and RN who spoke with [facility].  I have received passed off on this patient pending urinalysis only with plan for discharge home plus or minus antibiotics depending on urinalysis.  When I evaluated the patient, he is able to lift both of his legs off of the stretcher.  He denies any complaints.  Labs obtained are overall unremarkable.  CT head and neck obtained are negative for any acute findings.  Urinalysis obtained and is highly suggestive of urinary tract infection.  He was given a dose of Rocephin .  Urine was sent for culture.  Patient's presentation is most consistent with acute complicated illness / injury requiring diagnostic workup.  The patient is on the cardiac monitor to evaluate for evidence of arrhythmia and/or significant heart rate changes.  Patient's diagnosis is consistent with generalized weakness, unwitnessed fall. Patient will be discharged home with prescriptions for cefdinir . Patient is to follow up with outpatient  as needed or otherwise directed. Patient is given ED precautions to return to the ED for any worsening or new symptoms.  Clinical Course as of 02/06/24 1800  Wed Feb 06, 2024  1523 Assumed care from [facility], pending UA, with plan to discharge back to facility [JP]    Clinical Course User Index [JP] Jian Hodgman E, PA-C    FINAL CLINICAL IMPRESSION(S) / ED DIAGNOSES   Final diagnoses:  Fall, initial  encounter  Acute cystitis without hematuria  Generalized weakness     Rx / DC Orders   ED Discharge Orders          Ordered    cefdinir  (OMNICEF ) 300 MG capsule  2 times daily        02/06/24 1714             Note:  This document was prepared using Dragon voice recognition software and may include unintentional dictation errors.    Iretta Mangrum E, PA-C 02/06/24 1800     Arlander Charleston, MD 02/06/24 [phone]

## 2024-02-06 NOTE — ED Provider Notes (Signed)
 [facility] Provider Note    Event Date/Time   First MD Initiated Contact with Patient 02/06/24 1251     (approximate)   History   Fall   HPI  Travis Mcconnell is a 83 y.o. male with history of CVA, PVCs, hypertension and as listed in EMR presents to the emergency department for treatment and evaluation after unwitnessed fall around 2 AM.  Abrasion noted to the top of his head.  He is not currently on blood thinners.  Per report he is at his cognitive baseline.  He is a resident at [facility]. Patient does not recall falling and denies headache, neck, back or extremity pain.   Physical Exam    Vitals:   02/06/24 1224  BP: 127/87  Pulse: (!) 59  Resp: 17  Temp: 98.2 F (36.8 C)  SpO2: 98%    General: Awake, no distress.  CV:  Good peripheral perfusion.  Resp:  Normal effort.  Abd:  No distention.  Other:  No focal tenderness along the length of the spine.  No joint pain or extremity pain with movement.   ED Results / Procedures / Treatments   Labs (all labs ordered are listed, but only abnormal results are displayed)  Labs Reviewed  COMPREHENSIVE METABOLIC PANEL WITH GFR - Abnormal; Notable for the following components:      Result Value   Creatinine, Ser 1.88 (*)    Calcium  8.8 (*)    Albumin 3.2 (*)    GFR, Estimated 35 (*)    All other components within normal limits  CBC WITH DIFFERENTIAL/PLATELET - Abnormal; Notable for the following components:   Hemoglobin 12.0 (*)    HCT 37.8 (*)    Platelets 149 (*)    All other components within normal limits  URINALYSIS, ROUTINE W REFLEX MICROSCOPIC  TROPONIN I (HIGH SENSITIVITY)  TROPONIN I (HIGH SENSITIVITY)     EKG  Sinus bradycardia with a rate of 57   RADIOLOGY  Image and radiology report reviewed and interpreted by me. Radiology report consistent with the same.  CT head and cervical spine are negative for acute concerns.  PROCEDURES:  Critical Care performed:  No  Procedures   MEDICATIONS ORDERED IN ED:  Medications - No data to display   IMPRESSION / MDM / ASSESSMENT AND PLAN / ED COURSE   I have reviewed the triage note and vital signs. Vital signs are stable   Differential diagnosis includes, but is not limited to, abrasion, intracranial hemorrhage, nontraumatic fall, electrolyte abnormality, cardiac event, urinary tract infection.  Patient's presentation is most consistent with acute presentation with potential threat to life or bodily function.  83 year old male presenting to the emergency department for treatment and evaluation after unwitnessed fall at [facility] at about 2 AM.  Per EMS report, patient is at his cognitive baseline however he does not recall this fall.  He denies injury or pain.  CT of the head and cervical spine are both negative for acute concerns.  Because the fall was unwitnessed, lab studies, urinalysis, and EKG ordered.  Clinical Course as of 02/06/24 1525  Wed Feb 06, 2024  1523 Assumed care from [facility], pending UA, with plan to discharge back to facility [JP]    Clinical Course User Index [JP] Poggi, Jenna E, PA-C     FINAL CLINICAL IMPRESSION(S) / ED DIAGNOSES   Final diagnoses:  Fall, initial encounter     Rx / DC Orders   ED Discharge Orders  None        Note:  This document was prepared using Dragon voice recognition software and may include unintentional dictation errors.   Herlinda Kirk NOVAK, FNP 02/06/24 1525    Suzanne Kirsch, MD 02/06/24 [phone]

## 2024-02-06 NOTE — Discharge Instructions (Addendum)
 Please take the antibiotics as prescribed for your urinary tract infection.  Please follow-up with your outpatient provider.  Please return for any new, worsening, or change in symptoms or other concerns.  It was a pleasure caring for you today.

## 2024-02-06 NOTE — ED Notes (Signed)
 [facility] called for transport to [facility] , spoke with Octaviano

## 2024-02-06 NOTE — ED Notes (Signed)
 Pt denies knowing that he fell, doesn't recall being found on the floor and states that he wasn't on the floor, pt has a scrape to the back top of his head, pt doesn't recall how that happened. Denies pain, pt was assisted to stand but unable to use the walker due to unsteadiness

## 2024-02-06 NOTE — ED Triage Notes (Signed)
 First Nurse Note: Patient to ED via [facility] from [facility] for a fall. Unwitnessed fall around 2am- abrasion noted to head. No blood thinners, at baseline mentation.   132/75 99% RA 61 HR

## 2024-02-06 NOTE — ED Notes (Signed)
 Gave pt a [place] sandwich meal and ginger ale.  Pt is eating.  No distress. Toileting offered

## 2024-02-06 NOTE — ED Notes (Signed)
 called to [facility] 714pm for update on transport/Rep Bob.

## 2024-02-08 LAB — URINE CULTURE: Culture: >=100000 — AB

## 2024-03-26 NOTE — Telephone Encounter (Signed)
 Faxed patient labs to his facility. I did call the number on file, however I did go to voicemail and the box was full.
